# Patient Record
Sex: Male | Born: 1980 | Race: White | Hispanic: No | Marital: Married | Smoking: Former smoker
Health system: Southern US, Community
[De-identification: ages and names within clinical notes are randomized; demographics above are authoritative.]

## PROBLEM LIST (undated history)

## (undated) DIAGNOSIS — I10 Essential (primary) hypertension: Secondary | ICD-10-CM

## (undated) DIAGNOSIS — IMO0002 Reserved for concepts with insufficient information to code with codable children: Secondary | ICD-10-CM

## (undated) HISTORY — DX: Reserved for concepts with insufficient information to code with codable children: IMO0002

## (undated) HISTORY — PX: MULTIPLE TOOTH EXTRACTIONS: SHX2053

---

## 2007-03-25 ENCOUNTER — Ambulatory Visit: Payer: Self-pay | Admitting: Cardiology

## 2007-08-17 ENCOUNTER — Emergency Department (HOSPITAL_COMMUNITY): Admission: EM | Admit: 2007-08-17 | Discharge: 2007-08-17 | Payer: Self-pay | Admitting: Emergency Medicine

## 2010-11-17 LAB — CBC
HCT: 46
Hemoglobin: 15.7
Platelets: 196
RDW: 14.6
WBC: 7.6

## 2010-11-17 LAB — DIFFERENTIAL
Basophils Absolute: 0
Eosinophils Relative: 1
Lymphocytes Relative: 34
Lymphs Abs: 2.6
Neutro Abs: 4.3

## 2010-11-17 LAB — URIC ACID: Uric Acid, Serum: 7.9 — ABNORMAL HIGH

## 2013-08-18 ENCOUNTER — Encounter: Payer: Self-pay | Admitting: Family Medicine

## 2013-08-18 ENCOUNTER — Encounter (INDEPENDENT_AMBULATORY_CARE_PROVIDER_SITE_OTHER): Payer: Self-pay

## 2013-08-18 ENCOUNTER — Ambulatory Visit (INDEPENDENT_AMBULATORY_CARE_PROVIDER_SITE_OTHER): Payer: Medicaid Other | Admitting: Family Medicine

## 2013-08-18 VITALS — BP 131/89 | HR 70 | Temp 98.1°F | Ht 70.0 in | Wt 299.2 lb

## 2013-08-18 DIAGNOSIS — I493 Ventricular premature depolarization: Secondary | ICD-10-CM

## 2013-08-18 DIAGNOSIS — R0789 Other chest pain: Secondary | ICD-10-CM

## 2013-08-18 DIAGNOSIS — R0602 Shortness of breath: Secondary | ICD-10-CM

## 2013-08-18 DIAGNOSIS — I4949 Other premature depolarization: Secondary | ICD-10-CM

## 2013-08-18 DIAGNOSIS — K219 Gastro-esophageal reflux disease without esophagitis: Secondary | ICD-10-CM

## 2013-08-18 DIAGNOSIS — I10 Essential (primary) hypertension: Secondary | ICD-10-CM

## 2013-08-18 LAB — POCT CBC
Granulocyte percent: 60.5 %G (ref 37–80)
HCT, POC: 47.8 % (ref 43.5–53.7)
Hemoglobin: 15.4 g/dL (ref 14.1–18.1)
Lymph, poc: 2.7 (ref 0.6–3.4)
MCH, POC: 29.8 pg (ref 27–31.2)
MCHC: 32.2 g/dL (ref 31.8–35.4)
MCV: 92.6 fL (ref 80–97)
MPV: 9 fL (ref 0–99.8)
POC Granulocyte: 4.9 (ref 2–6.9)
POC LYMPH PERCENT: 33.3 %L (ref 10–50)
Platelet Count, POC: 220 10*3/uL (ref 142–424)
RBC: 5.2 M/uL (ref 4.69–6.13)
RDW, POC: 14.1 %
WBC: 8.1 10*3/uL (ref 4.6–10.2)

## 2013-08-18 MED ORDER — HYDROCHLOROTHIAZIDE 25 MG PO TABS: ORAL_TABLET | ORAL | Status: DC

## 2013-08-18 NOTE — Addendum Note (Signed)
Addended by: Tamera PuntWRAY, WENDY S on: 08/18/2013 09:36 AM   Modules accepted: Orders

## 2013-08-18 NOTE — Progress Notes (Signed)
   Subjective:    Patient ID: Jerry Warren, male    DOB: [birth year], 33 y.o.   MRN: [id number]  HPI This 33 y.o. male presents for evaluation of needing bp medicine.  He has not been seen by medical Manley Fason in over 5 years he states.  He states last week he had an episode of shortness of breath and and sharp chest pain.  He states he was on bp medicine but it ran out and he has been told he needs to get back on bp meds.  He had episode over 5 years ago of chest discomfort and saw a doctor who did an [place] of his heart and was told his heart was ok.  He has family hx of CAD(father).  He does snore but denies having sleep apnea and wife who accompanies states he does not quit breathing at night. He was having some GERD sx's that night as well he states.  He denies SOB or chest pain at present.   Review of Systems C/o SOB, GERD and chest pain.   No  HA, dizziness, vision change, N/V, diarrhea, constipation, dysuria, urinary urgency or frequency, myalgias, arthralgias or rash.  Objective:   Physical Exam  Vital signs noted  Well developed well nourished male.  HEENT - Head atraumatic Normocephalic                Eyes - PERRLA, Conjuctiva - clear Sclera- Clear EOMI                Ears - EAC's Wnl TM's Wnl Gross Hearing WNL                Throat - oropharanx wnl Respiratory - Lungs CTA bilateral Cardiac - RRR S1 and S2 without murmur GI - Abdomen obese soft Nontender and bowel sounds active x 4 Extremities - No edema. Neuro - Grossly intact.  EKG - NSR with PVC's.  No acute st-t changes. 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  Essential hypertension - Plan: hydrochlorothiazide (HYDRODIURIL) 25 MG tablet, POCT CBC, CMP14+EGFR, Lipid panel, Thyroid Panel With TSH, EKG 12-Lead  Shortness of breath - Plan: hydrochlorothiazide (HYDRODIURIL) 25 MG tablet, POCT CBC, CMP14+EGFR, Lipid panel, Thyroid Panel With TSH, EKG  12-Lead  Other chest pain - Plan: hydrochlorothiazide (HYDRODIURIL) 25 MG tablet, POCT CBC, CMP14+EGFR, Lipid panel, Thyroid Panel With TSH, EKG 12-Lead  Gastroesophageal reflux disease without esophagitis - Plan: hydrochlorothiazide (HYDRODIURIL) 25 MG tablet, POCT CBC, CMP14+EGFR, Lipid panel, Thyroid Panel With TSH, EKG 12-Lead  Follow up in 2 weeks  Lysbeth Penner FNP

## 2013-08-19 ENCOUNTER — Other Ambulatory Visit: Payer: Self-pay | Admitting: Family Medicine

## 2013-08-19 DIAGNOSIS — I493 Ventricular premature depolarization: Secondary | ICD-10-CM

## 2013-08-19 LAB — CMP14+EGFR
ALT: 28 IU/L (ref 0–44)
AST: 25 IU/L (ref 0–40)
Albumin/Globulin Ratio: 1.9 (ref 1.1–2.5)
Albumin: 4.9 g/dL (ref 3.5–5.5)
Alkaline Phosphatase: 88 IU/L (ref 39–117)
BUN/Creatinine Ratio: 12 (ref 8–19)
BUN: 17 mg/dL (ref 6–20)
CO2: 22 mmol/L (ref 18–29)
Calcium: 9.4 mg/dL (ref 8.7–10.2)
Chloride: 98 mmol/L (ref 97–108)
Creatinine, Ser: 1.39 mg/dL — ABNORMAL HIGH (ref 0.76–1.27)
GFR calc Af Amer: 76 mL/min/{1.73_m2} (ref >59)
GFR calc non Af Amer: 66 mL/min/{1.73_m2} (ref >59)
Globulin, Total: 2.6 g/dL (ref 1.5–4.5)
Glucose: 92 mg/dL (ref 65–99)
Potassium: 4.5 mmol/L (ref 3.5–5.2)
Sodium: 141 mmol/L (ref 134–144)
Total Bilirubin: 0.4 mg/dL (ref 0.0–1.2)
Total Protein: 7.5 g/dL (ref 6.0–8.5)

## 2013-08-19 LAB — THYROID PANEL WITH TSH
Free Thyroxine Index: 1.8 (ref 1.2–4.9)
T3 Uptake Ratio: 24 % (ref 24–39)
T4, Total: 7.7 ug/dL (ref 4.5–12.0)
TSH: 1.85 u[IU]/mL (ref 0.450–4.500)

## 2013-08-19 LAB — LIPID PANEL
Chol/HDL Ratio: 7.2 ratio units — ABNORMAL HIGH (ref 0.0–5.0)
Cholesterol, Total: 224 mg/dL — ABNORMAL HIGH (ref 100–199)
HDL: 31 mg/dL — ABNORMAL LOW (ref >39)
LDL Calculated: 139 mg/dL — ABNORMAL HIGH (ref 0–99)
Triglycerides: 271 mg/dL — ABNORMAL HIGH (ref 0–149)
VLDL Cholesterol Cal: 54 mg/dL — ABNORMAL HIGH (ref 5–40)

## 2013-08-29 ENCOUNTER — Encounter: Payer: Self-pay | Admitting: Cardiology

## 2013-08-29 ENCOUNTER — Ambulatory Visit (INDEPENDENT_AMBULATORY_CARE_PROVIDER_SITE_OTHER): Payer: Medicaid Other | Admitting: Cardiology

## 2013-08-29 VITALS — BP 142/98 | HR 82 | Ht 71.0 in | Wt 299.8 lb

## 2013-08-29 DIAGNOSIS — R002 Palpitations: Secondary | ICD-10-CM

## 2013-08-29 DIAGNOSIS — I493 Ventricular premature depolarization: Secondary | ICD-10-CM

## 2013-08-29 DIAGNOSIS — I4949 Other premature depolarization: Secondary | ICD-10-CM

## 2013-08-29 DIAGNOSIS — R0602 Shortness of breath: Secondary | ICD-10-CM

## 2013-08-29 NOTE — Patient Instructions (Signed)
Your physician has requested that you have an echocardiogram. Echocardiography is a painless test that uses sound waves to create images of your heart. It provides your doctor with information about the size and shape of your heart and how well your heart's chambers and valves are working. This procedure takes approximately one hour. There are no restrictions for this procedure. Office will contact with results via phone or letter.   Continue all current medications. Follow up based on test results  

## 2013-08-29 NOTE — Progress Notes (Signed)
     Clinical Summary [name] is a 33 y.o.male seen today as a new patient for the following medical problems.  1. Palpitations - reports one day approx 2 weeks ago, feeling of "chest feeling funny". Mild fluttering, with mild SOB. No chest pain. Lasted 30 minutes. Occurred after being outside in heat for awhile. No other episodes of palpitations - primary did EKG showed  NSR with PVCs.  -holter ordered by pcp. Showed NSR with frequent PVCs (24,000 in 24 hrs, 24%), no runs of NSVT or VT. No arrhythmias - K 4.5, TSH 1.85  - denies any lightheadedness or dizziness. No SOB or DOE, does heavy yard work without limitations. No LE edema, no orthopnea or PND.  - rare EtoH, minimal caffeine.   Past Medical History  Diagnosis Date  . Herniated disc      No Known Allergies   Current Outpatient Prescriptions  Medication Sig Dispense Refill  . aspirin 81 MG tablet Take 81 mg by mouth daily.      . hydrochlorothiazide (HYDRODIURIL) 25 MG tablet One half po qd  15 tablet  5   No current facility-administered medications for this visit.     Past Surgical History  Procedure Laterality Date  . Multiple tooth extractions       No Known Allergies    Family History  Problem Relation Age of Onset  . Diabetes Father   . Hypertension Father      Social History [name] reports that he has quit smoking. He has never used smokeless tobacco. [name] reports that he does not drink alcohol.   Review of Systems CONSTITUTIONAL: No weight loss, fever, chills, weakness or fatigue.  HEENT: Eyes: No visual loss, blurred vision, double vision or yellow sclerae.No hearing loss, sneezing, congestion, runny nose or sore throat.  SKIN: No rash or itching.  CARDIOVASCULAR: per HPI RESPIRATORY: No shortness of breath, cough or sputum.  GASTROINTESTINAL: No anorexia, nausea, vomiting or diarrhea. No abdominal pain or blood.  GENITOURINARY: No burning on urination, no polyuria NEUROLOGICAL:  No headache, dizziness, syncope, paralysis, ataxia, numbness or tingling in the extremities. No change in bowel or bladder control.  MUSCULOSKELETAL: No muscle, back pain, joint pain or stiffness.  LYMPHATICS: No enlarged nodes. No history of splenectomy.  PSYCHIATRIC: No history of depression or anxiety.  ENDOCRINOLOGIC: No reports of sweating, cold or heat intolerance. No polyuria or polydipsia.  Marland Kitchen.   Physical Examination p 82 bp 142/98 Wt 299 lbs BMI 42 Gen: resting comfortably, no acute distress HEENT: no scleral icterus, pupils equal round and reactive, no palptable cervical adenopathy,  CV: RRR, no m/r/g, no JVD, no carotid bruits Resp: Clear to auscultation bilaterally GI: abdomen is soft, non-tender, non-distended, normal bowel sounds, no hepatosplenomegaly MSK: extremities are warm, no edema.  Skin: warm, no rash Neuro:  no focal deficits Psych: appropriate affect   Assessment and Plan  1. Palpitations - isolated fairly mild episode, however holter shows heavy PVC burden at 24% (over 24,000), appear unifocal. No NSVT or VT. Frequency that can potentially be associated with a PVC induced cardiomyopathy.  - will obtain echo as initial evaluation for structural heart disease, further workup pending results. Likely initiate beta blocker once LVEF is known to suppress PVCs. Consider ischemic evaluation and/or potential EP eval pending echo results.     F/u pending echo results  Antoine PocheJonathan F. Branch, M.D., F.A.C.C.

## 2013-10-01 ENCOUNTER — Other Ambulatory Visit: Payer: Medicaid Other

## 2013-10-06 ENCOUNTER — Other Ambulatory Visit: Payer: Self-pay

## 2013-10-06 ENCOUNTER — Other Ambulatory Visit (INDEPENDENT_AMBULATORY_CARE_PROVIDER_SITE_OTHER): Payer: Medicaid Other

## 2013-10-06 DIAGNOSIS — I4949 Other premature depolarization: Secondary | ICD-10-CM

## 2013-10-06 DIAGNOSIS — R0602 Shortness of breath: Secondary | ICD-10-CM

## 2013-10-09 ENCOUNTER — Telehealth: Payer: Self-pay | Admitting: *Deleted

## 2013-10-09 ENCOUNTER — Encounter: Payer: Self-pay | Admitting: *Deleted

## 2013-10-09 DIAGNOSIS — R079 Chest pain, unspecified: Secondary | ICD-10-CM

## 2013-10-09 DIAGNOSIS — I493 Ventricular premature depolarization: Secondary | ICD-10-CM

## 2013-10-09 NOTE — Telephone Encounter (Signed)
Message copied by Lesle ChrisHILL, ANGELA G on Thu Oct 09, 2013  1:58 PM ------      Message from: GypsumBRANCH, JONATHAN F      Created: Wed Oct 08, 2013  1:55 PM       Echo looks good. The cause of his frequent irregular heart beats remains unclear. One possible cause could be blockages in the arteries of the heart, though less likely due to his age. He needs an exercise cardiolite if he can exercise, if not then [facility]. He does not need to hold any Emelda Brothersmes            J Branch MD ------

## 2013-10-09 NOTE — Telephone Encounter (Signed)
Notes Recorded by Lesle ChrisAngela G Hill, LPN on 4/09/81198/20/2015 at 1:58 PM Patient notified. He is in agreement to do the Exercise Cardiolite. Instructions given to not eat/drink x 4 hours prior to test & no caffeine x 24 hours. Also, informed that he does not need to hold any medications prior to test.  [facility] for scheduling.

## 2013-10-16 ENCOUNTER — Other Ambulatory Visit: Payer: Self-pay | Admitting: Cardiology

## 2013-10-16 DIAGNOSIS — R079 Chest pain, unspecified: Secondary | ICD-10-CM

## 2013-10-17 ENCOUNTER — Encounter (HOSPITAL_COMMUNITY): Payer: Self-pay

## 2013-10-17 ENCOUNTER — Ambulatory Visit (HOSPITAL_COMMUNITY)
Admission: RE | Admit: 2013-10-17 | Discharge: 2013-10-17 | Disposition: A | Discharge status: Home / Self Care | Payer: Medicaid Other | Source: Ambulatory Visit | Attending: Cardiology | Admitting: Cardiology

## 2013-10-17 ENCOUNTER — Encounter (HOSPITAL_COMMUNITY)
Admission: RE | Admit: 2013-10-17 | Discharge: 2013-10-17 | Disposition: A | Discharge status: Home / Self Care | Payer: Medicaid Other | Source: Ambulatory Visit | Attending: Cardiology | Admitting: Cardiology

## 2013-10-17 DIAGNOSIS — I10 Essential (primary) hypertension: Secondary | ICD-10-CM | POA: Insufficient documentation

## 2013-10-17 DIAGNOSIS — R002 Palpitations: Secondary | ICD-10-CM | POA: Insufficient documentation

## 2013-10-17 DIAGNOSIS — I4949 Other premature depolarization: Secondary | ICD-10-CM | POA: Diagnosis not present

## 2013-10-17 DIAGNOSIS — R079 Chest pain, unspecified: Secondary | ICD-10-CM

## 2013-10-17 HISTORY — DX: Essential (primary) hypertension: I10

## 2013-10-17 MED ORDER — SODIUM CHLORIDE 0.9 % IJ SOLN
INTRAMUSCULAR | Status: AC
  Filled 2013-10-17: qty 24

## 2013-10-17 MED ORDER — SODIUM CHLORIDE 0.9 % IJ SOLN
10.0000 mL | INTRAMUSCULAR | Status: DC | PRN
  Administered 2013-10-17: 10 mL via INTRAVENOUS

## 2013-10-17 MED ORDER — REGADENOSON 0.4 MG/5ML IV SOLN
INTRAVENOUS | Status: AC
  Filled 2013-10-17: qty 5

## 2013-10-17 MED ORDER — SODIUM CHLORIDE 0.9 % IJ SOLN
INTRAMUSCULAR | Status: AC
  Administered 2013-10-17: 10 mL via INTRAVENOUS
  Filled 2013-10-17: qty 10

## 2013-10-17 MED ORDER — TECHNETIUM TC 99M SESTAMIBI - CARDIOLITE
10.0000 | Freq: Once | INTRAVENOUS | Status: AC | PRN
  Administered 2013-10-17: 10 via INTRAVENOUS

## 2013-10-17 MED ORDER — TECHNETIUM TC 99M SESTAMIBI GENERIC - CARDIOLITE
30.0000 | Freq: Once | INTRAVENOUS | Status: AC | PRN
  Administered 2013-10-17: 30 via INTRAVENOUS

## 2013-10-17 NOTE — Progress Notes (Signed)
Stress Lab Nurses Notes - [facility]  OSRIC KLOPF 10/17/2013 Reason for doing test: Palpitations Type of test: Stress Cardiolite Nurse performing test: Parke Poisson, RN Nuclear Medicine Tech: Lyndel Pleasure Echo Tech: Not Applicable MD performing test: Koneswaran/K.Lyman Bishop NP Family MD: Christell Constant Test explained and consent signed: Yes.   IV started: Saline lock flushed, No redness or edema and Saline lock started in radiology Symptoms: SOB & Fatigue Treatment/Intervention: None Reason test stopped: fatigue After recovery IV was: Discontinued via X-ray tech and No redness or edema Patient to return to Nuc. Med at : 12:00 Patient discharged: Home Patient's Condition upon discharge was: stable Comments: During test peak BP 188/73 & HR 166.  Recovery BP 113/80 & HR 98.  Symptoms resolved in recovery. Erskine Speed T

## 2013-10-21 ENCOUNTER — Telehealth: Payer: Self-pay | Admitting: *Deleted

## 2013-10-21 NOTE — Telephone Encounter (Signed)
Notes Recorded by Lesle Chris, LPN on 4/0/9811 at 5:38 PM Left message to return call.

## 2013-10-21 NOTE — Telephone Encounter (Signed)
Message copied by Lesle Chris on Tue Oct 21, 2013  5:38 PM ------      Message from: [place] F      Created: Mon Oct 20, 2013  3:09 PM       Stress test looks good, no evidence of any blockages. It remains unclear why his heart has so many extra beats at times. I would like to start him on metoprolol  bid to suppress these extra beats, overtime that frequent amount of beats can cause weakness of the heart. I would also like to refer him to Dr Johney Frame for frequent PVCs. F/u with me in 4 months.            Dominga Ferry MD ------

## 2013-10-22 NOTE — Telephone Encounter (Signed)
RETURNING CALL  °

## 2013-10-23 MED ORDER — METOPROLOL TARTRATE 25 MG PO TABS: 25.0000 mg | ORAL_TABLET | Freq: Two times a day (BID) | ORAL | Status: DC

## 2013-10-23 NOTE — Telephone Encounter (Signed)
Notes Recorded by Lesle Chris, LPN on 02/25/1094 at 11:58 AM Patient notified. Will send new rx to [facility]. States that he prefers to hold off on seeing Dr. Johney Frame just yet. Would like to see how this medications does first. Advised him to call back in about a month to give [place] update. He will see you back in 4 months. Informed patient that he could reassess the need to see Dr. Johney Frame at that time, unless MD feels strongly that he should be seen sooner. Patient verbalized understanding. ------

## 2014-03-27 ENCOUNTER — Ambulatory Visit (INDEPENDENT_AMBULATORY_CARE_PROVIDER_SITE_OTHER): Payer: Medicaid Other | Admitting: Cardiology

## 2014-03-27 ENCOUNTER — Encounter: Payer: Self-pay | Admitting: Cardiology

## 2014-03-27 VITALS — BP 140/88 | HR 81 | Ht 71.0 in | Wt 303.0 lb

## 2014-03-27 DIAGNOSIS — R002 Palpitations: Secondary | ICD-10-CM

## 2014-03-27 MED ORDER — METOPROLOL TARTRATE 25 MG PO TABS: 25.0000 mg | ORAL_TABLET | Freq: Two times a day (BID) | ORAL | Status: DC

## 2014-03-27 MED ORDER — HYDROCHLOROTHIAZIDE 25 MG PO TABS: ORAL_TABLET | ORAL | Status: DC

## 2014-03-27 NOTE — Patient Instructions (Signed)
Your physician wants you to follow-up in: 1 YEAR WITH DR. BRANCH You will receive a reminder letter in the mail two months in advance. If you don't receive a letter, please call our office to schedule the follow-up appointment.  Your physician recommends that you continue on your current medications as directed. Please refer to the Current Medication list given to you today.  WE HAVE REFILLED METOPROLOL AND HCTZ  Thank you for choosing [facility]!!

## 2014-03-27 NOTE — Progress Notes (Signed)
Clinical Summary [name] is a 34 y.o.male seen today for follow up of the following medical problems.   1. Palpitations  -previous holter showed NSR with frequent PVCs (24,000 in 24 hrs, 24%), no runs of NSVT or VT. No arrhythmias - no structural heart disease by echo or stress testint  - denies any recent palpitations. Tolerating beta blocker without issues.    Past Medical History  Diagnosis Date  . Herniated disc   . Hypertension      No Known Allergies   Current Outpatient Prescriptions  Medication Sig Dispense Refill  . hydrochlorothiazide (HYDRODIURIL) 25 MG tablet One half po qd 15 tablet 5  . metoprolol tartrate (LOPRESSOR) 25 MG tablet Take 1 tablet (25 mg total) by mouth 2 (two) times daily. 60 tablet 6  . Omega-3 Fatty Acids (FISH OIL) 1000 MG CAPS Take 1 capsule by mouth daily.     No current facility-administered medications for this visit.     Past Surgical History  Procedure Laterality Date  . Multiple tooth extractions       No Known Allergies    Family History  Problem Relation Age of Onset  . Diabetes Father   . Hypertension Father      Social History [name] reports that he has quit smoking. He has never used smokeless tobacco. [name] reports that he does not drink alcohol.   Review of Systems CONSTITUTIONAL: No weight loss, fever, chills, weakness or fatigue.  HEENT: Eyes: No visual loss, blurred vision, double vision or yellow sclerae.No hearing loss, sneezing, congestion, runny nose or sore throat.  SKIN: No rash or itching.  CARDIOVASCULAR: per HPI RESPIRATORY: No shortness of breath, cough or sputum.  GASTROINTESTINAL: No anorexia, nausea, vomiting or diarrhea. No abdominal pain or blood.  GENITOURINARY: No burning on urination, no polyuria NEUROLOGICAL: No headache, dizziness, syncope, paralysis, ataxia, numbness or tingling in the extremities. No change in bowel or bladder control.  MUSCULOSKELETAL: No muscle, back  pain, joint pain or stiffness.  LYMPHATICS: No enlarged nodes. No history of splenectomy.  PSYCHIATRIC: No history of depression or anxiety.  ENDOCRINOLOGIC: No reports of sweating, cold or heat intolerance. No polyuria or polydipsia.  Marland Kitchen.   Physical Examination p 81 bp 140/88 Wt 303 BMI 42 Gen: resting comfortably, no acute distress HEENT: no scleral icterus, pupils equal round and reactive, no palptable cervical adenopathy,  CV: RRR, no m/r/g, no JVD Resp: Clear to auscultation bilaterally GI: abdomen is soft, non-tender, non-distended, normal bowel sounds, no hepatosplenomegaly MSK: extremities are warm, no edema.  Skin: warm, no rash Neuro:  no focal deficits Psych: appropriate affect   Diagnostic Studies 09/2013 Echo Study Conclusions  - Left ventricle: The cavity size was normal. There was mild septal and moderate posterior wall hypertrophy. Systolic function was normal. The estimated ejection fraction was in the range of 55% to 60%. Wall motion was normal; there were no regional wall motion abnormalities. Doppler parameters are consistent with abnormal left ventricular relaxation (grade 1 diastolic dysfunction).  09/2013 MPI FINDINGS: Stress/ECG data: The patient was stressed according to the Bruce protocol for 8 min 26 seconds achieving a work level of 10.4 Mets. The resting heart rate of 76 beats per min rose to a maximal heart rate of 169 beats per min. This value represents 90% of the maximal, age predicted heart rate. The resting blood pressure of 130/95 rose to 188/73. The stress test was stopped due to fatigue. No chest pain was reported.  Resting ECG demonstrated normal sinus rhythm. With stress, there were no diagnostic ST segment or T-wave abnormalities. Isolated PVCs were noted. No evidence of ventricular tachycardia.  Perfusion: No decreased activity in the left ventricle on stress imaging to suggest reversible ischemia or  infarction.  Wall Motion: Normal left ventricular wall motion. No left ventricular dilation.  Left Ventricular Ejection Fraction: 49 %  End diastolic volume 114 ml  End systolic volume 59 ml  IMPRESSION: 1. No reversible ischemia or infarction.  2. Normal left ventricular wall motion.  3. Left ventricular ejection fraction 49%. However, grossly there appeared to be normal LV systolic funtion. Would recommend echocardiogram for more accurate EF assessment.  4. Low-risk stress test findings*.    Assessment and Plan  1. Palpitations - isolated fairly mild episode, however holter shows heavy PVC burden at 24% (over 24,000), appear unifocal. No NSVT or VT.  - no structural heart disease by echo or stress test - symptoms well controlled on beta blocker, continue current therapy     Antoine Poche, M.D.

## 2014-05-20 ENCOUNTER — Other Ambulatory Visit: Payer: Self-pay | Admitting: Family Medicine

## 2014-12-25 ENCOUNTER — Other Ambulatory Visit: Payer: Self-pay | Admitting: *Deleted

## 2014-12-25 MED ORDER — METOPROLOL TARTRATE 25 MG PO TABS: 25.0000 mg | ORAL_TABLET | Freq: Two times a day (BID) | ORAL | Status: DC

## 2015-02-05 ENCOUNTER — Other Ambulatory Visit: Payer: Self-pay

## 2015-02-05 ENCOUNTER — Other Ambulatory Visit: Payer: Self-pay | Admitting: *Deleted

## 2015-02-05 DIAGNOSIS — R002 Palpitations: Secondary | ICD-10-CM

## 2015-02-05 MED ORDER — HYDROCHLOROTHIAZIDE 25 MG PO TABS
ORAL_TABLET | ORAL | Status: DC
Start: 2015-02-05 — End: 2015-04-03

## 2015-04-03 ENCOUNTER — Other Ambulatory Visit: Payer: Self-pay | Admitting: Cardiology

## 2015-05-09 ENCOUNTER — Other Ambulatory Visit: Payer: Self-pay | Admitting: Cardiology

## 2015-05-17 ENCOUNTER — Other Ambulatory Visit: Payer: Self-pay | Admitting: *Deleted

## 2015-05-17 MED ORDER — METOPROLOL TARTRATE 25 MG PO TABS: 25.0000 mg | ORAL_TABLET | Freq: Two times a day (BID) | ORAL | Status: DC

## 2015-06-21 ENCOUNTER — Ambulatory Visit (INDEPENDENT_AMBULATORY_CARE_PROVIDER_SITE_OTHER): Payer: Medicaid Other | Admitting: Cardiology

## 2015-06-21 ENCOUNTER — Encounter: Payer: Self-pay | Admitting: Cardiology

## 2015-06-21 ENCOUNTER — Other Ambulatory Visit: Payer: Self-pay | Admitting: *Deleted

## 2015-06-21 VITALS — BP 147/89 | HR 76 | Ht 71.0 in | Wt 309.0 lb

## 2015-06-21 DIAGNOSIS — R7309 Other abnormal glucose: Secondary | ICD-10-CM | POA: Diagnosis not present

## 2015-06-21 DIAGNOSIS — R002 Palpitations: Secondary | ICD-10-CM

## 2015-06-21 DIAGNOSIS — I10 Essential (primary) hypertension: Secondary | ICD-10-CM | POA: Diagnosis not present

## 2015-06-21 DIAGNOSIS — I493 Ventricular premature depolarization: Secondary | ICD-10-CM | POA: Diagnosis not present

## 2015-06-21 MED ORDER — METOPROLOL TARTRATE 25 MG PO TABS: 25.0000 mg | ORAL_TABLET | Freq: Two times a day (BID) | ORAL | Status: DC

## 2015-06-21 MED ORDER — HYDROCHLOROTHIAZIDE 25 MG PO TABS: 12.5000 mg | ORAL_TABLET | Freq: Every day | ORAL | Status: DC

## 2015-06-21 NOTE — Progress Notes (Signed)
Patient ID: Jerry Warren, male   DOB: [birth year], 35 y.o.   MRN: [id number]     Clinical Summary Jerry Warren is a 35 y.o.male seen today for follow up of the following medical problems.   1. Palpitations/PVCs -previous holter showed NSR with frequent PVCs (24,000 in 24 hrs, 24%), no runs of NSVT or VT. No arrhythmias - no structural heart disease by echo or stress testint - denies any palpitations. Denies any SOB. He has done well since starting lopressor - compliant with meds   Past Medical History  Diagnosis Date  . Herniated disc   . Hypertension      No Known Allergies   Current Outpatient Prescriptions  Medication Sig Dispense Refill  . hydrochlorothiazide (HYDRODIURIL) 25 MG tablet TAKE 1/2 TABLET BY MOUTH DAILY 3 tablet 0  . metoprolol tartrate (LOPRESSOR) 25 MG tablet Take 1 tablet (25 mg total) by mouth 2 (two) times daily. 60 tablet 1  . Omega-3 Fatty Acids (FISH OIL) 1000 MG CAPS Take 1 capsule by mouth daily.     No current facility-administered medications for this visit.     Past Surgical History  Procedure Laterality Date  . Multiple tooth extractions       No Known Allergies    Family History  Problem Relation Age of Onset  . Diabetes Father   . Hypertension Father      Social History Jerry Warren reports that he quit smoking about 21 months ago. He has never used smokeless tobacco. Jerry Warren reports that he does not drink alcohol.   Review of Systems CONSTITUTIONAL: No weight loss, fever, chills, weakness or fatigue.  HEENT: Eyes: No visual loss, blurred vision, double vision or yellow sclerae.No hearing loss, sneezing, congestion, runny nose or sore throat.  SKIN: No rash or itching.  CARDIOVASCULAR: per HPI RESPIRATORY: No shortness of breath, cough or sputum.  GASTROINTESTINAL: No anorexia, nausea, vomiting or diarrhea. No abdominal pain or blood.  GENITOURINARY: No burning on urination, no polyuria NEUROLOGICAL: No headache, dizziness,  syncope, paralysis, ataxia, numbness or tingling in the extremities. No change in bowel or bladder control.  MUSCULOSKELETAL: No muscle, back pain, joint pain or stiffness.  LYMPHATICS: No enlarged nodes. No history of splenectomy.  PSYCHIATRIC: No history of depression or anxiety.  ENDOCRINOLOGIC: No reports of sweating, cold or heat intolerance. No polyuria or polydipsia.  Marland Kitchen   Physical Examination Filed Vitals:   06/21/15 1548  BP: 147/89  Pulse: 76   Filed Vitals:   06/21/15 1548  Height:  (1.803 m)  Weight: 309 lb (140.161 kg)   Manual bp: 140/90  Gen: resting comfortably, no acute distress HEENT: no scleral icterus, pupils equal round and reactive, no palptable cervical adenopathy,  CV: RRR, no m/r,g no jvd Resp: Clear to auscultation bilaterally GI: abdomen is soft, non-tender, non-distended, normal bowel sounds, no hepatosplenomegaly MSK: extremities are warm, no edema.  Skin: warm, no rash Neuro:  no focal deficits Psych: appropriate affect   Diagnostic Studies 09/2013 Echo Study Conclusions  - Left ventricle: The cavity size was normal. There was mild septal and moderate posterior wall hypertrophy. Systolic function was normal. The estimated ejection fraction was in the range of 55% to 60%. Wall motion was normal; there were no regional wall motion abnormalities. Doppler parameters are consistent with abnormal left ventricular relaxation (grade 1 diastolic dysfunction).  09/2013 MPI FINDINGS: Stress/ECG data: The patient was stressed according to the Bruce protocol for 8 min 26 seconds achieving a work level  of 10.4 Mets. The resting heart rate of 76 beats per min rose to a maximal heart rate of 169 beats per min. This value represents 90% of the maximal, age predicted heart rate. The resting blood pressure of 130/95 rose to 188/73. The stress test was stopped due to fatigue. No chest pain was reported.  Resting ECG demonstrated normal  sinus rhythm. With stress, there were no diagnostic ST segment or T-wave abnormalities. Isolated PVCs were noted. No evidence of ventricular tachycardia.  Perfusion: No decreased activity in the left ventricle on stress imaging to suggest reversible ischemia or infarction.  Wall Motion: Normal left ventricular wall motion. No left ventricular dilation.  Left Ventricular Ejection Fraction: 49 %  End diastolic volume 114 ml  End systolic volume 59 ml  IMPRESSION: 1. No reversible ischemia or infarction.  2. Normal left ventricular wall motion.  3. Left ventricular ejection fraction 49%. However, grossly there appeared to be normal LV systolic funtion. Would recommend echocardiogram for more accurate EF assessment.  4. Low-risk stress test findings*.    Assessment and Plan  1. Palpitations/PVCs -  holter showedeavy PVC burden at 24% (over 24,000), appear unifocal. No NSVT or VT.  - no structural heart disease by echo or stress test - symptoms well controlled on beta blocker - EKG in clinic today shows normal sinus rhythm, no PVCs - we will repeat holter to see if PVC burden decreased on beta blocker, his burden at last check prior to beta blocker was high enough to raise concern for the development of a PVC medicated cardiomyopathy, if still high burden titrate up beta blocker   2. HTN - bp by manual check right at cut off of 140/90. Continue currrent meds, given info on DASH diet. Continue to monitor    F/u 1 year. We will order his annual labs.       Jerry PocheJonathan F. Askia Warren, M.D.

## 2015-06-21 NOTE — Patient Instructions (Signed)
Your physician wants you to follow-up in: 1 year with Dr. Lurena Joiner will receive a reminder letter in the mail two months in advance. If you don't receive a letter, please call our office to schedule the follow-up appointment.  Your physician recommends that you continue on your current medications as directed. Please refer to the Current Medication list given to you today.  Your physician recommends that you return for lab work BMP.CBC.TSH.HGBA1C.MG.LIPIDS  Your physician has recommended that you wear a holter monitor FOR 24 HOURS. Holter monitors are medical devices that record the heart's electrical activity. Doctors most often use these monitors to diagnose arrhythmias. Arrhythmias are problems with the speed or rhythm of the heartbeat. The monitor is a small, portable device. You can wear one while you do your normal daily activities. This is usually used to diagnose what is causing palpitations/syncope (passing out).  Thank you for choosing [facility]!!  DASH Eating Plan DASH stands for "Dietary Approaches to Stop Hypertension." The DASH eating plan is a healthy eating plan that has been shown to reduce high blood pressure (hypertension). Additional health benefits may include reducing the risk of type 2 diabetes mellitus, heart disease, and stroke. The DASH eating plan may also help with weight loss. WHAT DO I NEED TO KNOW ABOUT THE DASH EATING PLAN? For the DASH eating plan, you will follow these general guidelines:  Choose foods with a percent daily value for sodium of less than 5% (as listed on the food label).  Use salt-free seasonings or herbs instead of table salt or sea salt.  Check with your health care provider or pharmacist before using salt substitutes.  Eat lower-sodium products, often labeled as "lower sodium" or "no salt added."  Eat fresh foods.  Eat more vegetables, fruits, and low-fat dairy products.  Choose whole grains. Look for the word "whole" as the  first word in the ingredient list.  Choose fish and skinless chicken or [place] more often than red meat. Limit fish, poultry, and meat to 6 oz (170 g) each day.  Limit sweets, desserts, sugars, and sugary drinks.  Choose heart-healthy fats.  Limit cheese to 1 oz (28 g) per day.  Eat more home-cooked food and less restaurant, buffet, and fast food.  Limit fried foods.  Cook foods using methods other than frying.  Limit canned vegetables. If you do use them, rinse them well to decrease the sodium.  When eating at a restaurant, ask that your food be prepared with less salt, or no salt if possible. WHAT FOODS CAN I EAT? Seek help from a dietitian for individual calorie needs. Grains Whole grain or whole wheat bread. Brown rice. Whole grain or whole wheat pasta. Quinoa, bulgur, and whole grain cereals. Low-sodium cereals. Corn or whole wheat flour tortillas. Whole grain cornbread. Whole grain crackers. Low-sodium crackers. Vegetables Fresh or frozen vegetables (raw, steamed, roasted, or grilled). Low-sodium or reduced-sodium tomato and vegetable juices. Low-sodium or reduced-sodium tomato sauce and paste. Low-sodium or reduced-sodium canned vegetables.  Fruits All fresh, canned (in natural juice), or frozen fruits. Meat and Other Protein Products Ground beef (85% or leaner), grass-fed beef, or beef trimmed of fat. Skinless chicken or [place]. Ground chicken or [place]. Pork trimmed of fat. All fish and seafood. Eggs. Dried beans, peas, or lentils. Unsalted nuts and seeds. Unsalted canned beans. Dairy Low-fat dairy products, such as skim or 1% milk, 2% or reduced-fat cheeses, low-fat ricotta or cottage cheese, or plain low-fat yogurt. Low-sodium or reduced-sodium cheeses. Fats  and Oils Tub margarines without trans fats. Light or reduced-fat mayonnaise and salad dressings (reduced sodium). Avocado. Safflower, olive, or canola oils. Natural peanut or almond butter. Other Unsalted popcorn and  pretzels. The items listed above may not be a complete list of recommended foods or beverages. Contact your dietitian for more options. WHAT FOODS ARE NOT RECOMMENDED? Grains White bread. White pasta. White rice. Refined cornbread. Bagels and croissants. Crackers that contain trans fat. Vegetables Creamed or fried vegetables. Vegetables in a cheese sauce. Regular canned vegetables. Regular canned tomato sauce and paste. Regular tomato and vegetable juices. Fruits Dried fruits. Canned fruit in light or heavy syrup. Fruit juice. Meat and Other Protein Products Fatty cuts of meat. Ribs, chicken wings, bacon, sausage, bologna, salami, chitterlings, fatback, hot dogs, bratwurst, and packaged luncheon meats. Salted nuts and seeds. Canned beans with salt. Dairy Whole or 2% milk, cream, half-and-half, and cream cheese. Whole-fat or sweetened yogurt. Full-fat cheeses or blue cheese. Nondairy creamers and whipped toppings. Processed cheese, cheese spreads, or cheese curds. Condiments Onion and garlic salt, seasoned salt, table salt, and sea salt. Canned and packaged gravies. Worcestershire sauce. Tartar sauce. Barbecue sauce. Teriyaki sauce. Soy sauce, including reduced sodium. Steak sauce. Fish sauce. Oyster sauce. Cocktail sauce. Horseradish. Ketchup and mustard. Meat flavorings and tenderizers. Bouillon cubes. Hot sauce. Tabasco sauce. Marinades. Taco seasonings. Relishes. Fats and Oils Butter, stick margarine, lard, shortening, ghee, and bacon fat. Coconut, palm kernel, or palm oils. Regular salad dressings. Other Pickles and olives. Salted popcorn and pretzels. The items listed above may not be a complete list of foods and beverages to avoid. Contact your dietitian for more information. WHERE CAN I FIND MORE INFORMATION? National Heart, Lung, and Blood Institute: CableProm[employer].itwww.nhlbi.nih.gov/health/health-topics/topics/dash/   This information is not intended to replace advice given to you by your health care  provider. Make sure you discuss any questions you have with your health care provider.   Document Released: 01/26/2011 Document Revised: 02/27/2014 Document Reviewed: 12/11/2012 Elsevier Interactive Patient Education [employer]2016 Elsevier Inc.

## 2015-06-25 ENCOUNTER — Telehealth: Payer: Self-pay | Admitting: *Deleted

## 2015-06-25 NOTE — Telephone Encounter (Signed)
Pt made aware at nurse monitor visit, routed to pcp

## 2015-06-25 NOTE — Telephone Encounter (Signed)
No pcp

## 2015-06-25 NOTE — Telephone Encounter (Signed)
-----   Message from Antoine PocheJonathan F Branch, MD sent at 06/24/2015  1:12 PM EDT ----- Labs look good other than cholesterol is a little high, continue to work on diet and weight loss, would not make medication changes at this time  J BrancH MD

## 2015-06-29 ENCOUNTER — Ambulatory Visit (INDEPENDENT_AMBULATORY_CARE_PROVIDER_SITE_OTHER): Payer: Medicaid Other

## 2015-06-29 DIAGNOSIS — R002 Palpitations: Secondary | ICD-10-CM | POA: Diagnosis not present

## 2015-07-02 ENCOUNTER — Telehealth: Payer: Self-pay | Admitting: Cardiology

## 2015-07-02 NOTE — Telephone Encounter (Signed)
Mr. Jerry SalterHaley called stating that he is returning a call.

## 2015-07-05 NOTE — Telephone Encounter (Signed)
Pt aware, no pcp 

## 2015-07-06 ENCOUNTER — Ambulatory Visit (INDEPENDENT_AMBULATORY_CARE_PROVIDER_SITE_OTHER): Payer: Medicaid Other

## 2015-07-06 ENCOUNTER — Encounter: Payer: Self-pay | Admitting: Family Medicine

## 2015-07-06 ENCOUNTER — Ambulatory Visit (INDEPENDENT_AMBULATORY_CARE_PROVIDER_SITE_OTHER): Payer: Medicaid Other | Admitting: Family Medicine

## 2015-07-06 VITALS — BP 132/87 | HR 72 | Temp 98.2°F | Ht 71.0 in | Wt 306.6 lb

## 2015-07-06 DIAGNOSIS — M546 Pain in thoracic spine: Secondary | ICD-10-CM

## 2015-07-06 MED ORDER — NAPROXEN 500 MG PO TABS: 500.0000 mg | ORAL_TABLET | Freq: Two times a day (BID) | ORAL | Status: DC

## 2015-07-06 NOTE — Progress Notes (Signed)
   Subjective:    Patient ID: Jerry Warren, male    DOB: Jul 07, 1980, 35 y.o.   MRN: [id number]  HPI patient has some left upper back pain that radiates around to his abdomen. There is been no injury or trauma. He also has some symptoms of reflux and takes ranitidine and Tums which helped that problem. He has a history of lumbar disc disease having received injections in the past.  Patient Active Problem List   Diagnosis Date Noted  . Palpitations 08/29/2013  . 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 hydrochlorothiazide (HYDRODIURIL) 25 MG tablet Take 0.5 tablets (12.5 mg total) by mouth daily.  . metoprolol tartrate (LOPRESSOR) 25 MG tablet Take 1 tablet (25 mg total) by mouth 2 (two) times daily.  . [DISCONTINUED] Omega-3 Fatty Acids (FISH OIL) 1000 MG CAPS Take 1 capsule by mouth daily.   No facility-administered encounter medications on file as of 07/06/2015.      Review of Systems  Constitutional: Negative.   Musculoskeletal: Positive for gait problem. Negative for neck pain and neck stiffness.       Objective:   Physical Exam  Constitutional: He appears well-developed and well-nourished.  Abdominal: Soft. There is no tenderness. There is no rebound.  Musculoskeletal:  Back normal range of motion no increased pain with lateral bending or twisting. Abdomen is soft nontender no guarding or rebound          Assessment & Plan:  1. Left-sided thoracic back pain There appears to be some narrowing of the disc spaces I suspect he may have a thoracic disc with radiation of the pain around his torso and symptoms not really affected by change in present position per se.  Will try anti-inflammatory Naprosyn 500 mg twice a day. Have asked him to go ahead and take his ranitidine 150 mg twice a day for GI protection with the Naprosyn. If symptoms aren't better in a week or so consider prednisone for same effect  Frederica KusterStephen M  Cheyna Retana MD - DG Thoracic Spine 2 View; Future

## 2015-07-18 ENCOUNTER — Other Ambulatory Visit: Payer: Self-pay | Admitting: Family Medicine

## 2015-07-20 NOTE — Telephone Encounter (Signed)
Last filled 07/06/2015

## 2015-07-22 ENCOUNTER — Telehealth: Payer: Self-pay | Admitting: Family Medicine

## 2015-07-22 MED ORDER — NAPROXEN 500 MG PO TABS: 500.0000 mg | ORAL_TABLET | Freq: Two times a day (BID) | ORAL | Status: DC

## 2015-07-22 NOTE — Telephone Encounter (Signed)
Pts wife is aware 

## 2015-07-22 NOTE — Telephone Encounter (Signed)
Rx sent for 1 month  Agree with his PCP that he should take ranitidine twice daily to protect from ulcer development.  Should follow up with PCP for discussion if he is going to need it long term.   Murtis SinkSam Nikolus Marczak, MD [facility] 07/22/2015, 10:09 AM

## 2015-08-19 ENCOUNTER — Other Ambulatory Visit: Payer: Self-pay | Admitting: Family Medicine

## 2015-08-19 MED ORDER — NAPROXEN 500 MG PO TABS: 500.0000 mg | ORAL_TABLET | Freq: Two times a day (BID) | ORAL | Status: DC

## 2015-08-19 NOTE — Telephone Encounter (Signed)
Pt aware.

## 2015-08-19 NOTE — Telephone Encounter (Signed)
Refilled    Murtis SinkSam Riggin Cuttino, MD [facility] 08/19/2015, 10:00 AM

## 2015-09-16 ENCOUNTER — Other Ambulatory Visit: Payer: Self-pay | Admitting: Family Medicine

## 2015-09-20 ENCOUNTER — Other Ambulatory Visit: Payer: Self-pay | Admitting: Family Medicine

## 2015-11-12 ENCOUNTER — Other Ambulatory Visit: Payer: Self-pay | Admitting: Family Medicine

## 2015-12-15 ENCOUNTER — Other Ambulatory Visit: Payer: Self-pay | Admitting: Family Medicine

## 2016-01-17 ENCOUNTER — Other Ambulatory Visit: Payer: Self-pay | Admitting: Family Medicine

## 2016-02-15 ENCOUNTER — Other Ambulatory Visit: Payer: Self-pay | Admitting: Family Medicine

## 2016-03-21 ENCOUNTER — Other Ambulatory Visit: Payer: Self-pay | Admitting: *Deleted

## 2016-03-21 MED ORDER — NAPROXEN 500 MG PO TABS: 500.0000 mg | ORAL_TABLET | Freq: Two times a day (BID) | ORAL | 0 refills | Status: DC

## 2016-03-22 ENCOUNTER — Other Ambulatory Visit: Payer: Self-pay

## 2016-03-22 MED ORDER — NAPROXEN 500 MG PO TABS: 500.0000 mg | ORAL_TABLET | Freq: Two times a day (BID) | ORAL | 0 refills | Status: DC

## 2016-03-30 ENCOUNTER — Telehealth: Payer: Self-pay | Admitting: Family Medicine

## 2016-03-30 NOTE — Telephone Encounter (Signed)
Please schedule patient for an appointment to be seen by a provider

## 2016-03-30 NOTE — Telephone Encounter (Signed)
Pt hasn't been seen since May of 2017, does he ntbs again or can the naproxen be sent into the pharmacy?

## 2016-03-30 NOTE — Telephone Encounter (Signed)
Pt's wife advised he would ntbs and she states he no longer has medicaid and an office visit would cost too much. Advised pt Aleve is the same as Naproxen and you can get that otc and pt's wife said it doesn't help. Advised we would need to see pt before an rx could be sent over. Pt's wife voiced understanding.

## 2016-07-27 ENCOUNTER — Other Ambulatory Visit: Payer: Self-pay | Admitting: Physician Assistant

## 2016-07-27 ENCOUNTER — Other Ambulatory Visit: Payer: Self-pay | Admitting: Cardiology

## 2017-03-26 IMAGING — DX DG THORACIC SPINE 2V
4 series · 4 of 4 positions shown · non-contrast
Comparison: None

CLINICAL DATA: Mid back pain

EXAM:
THORACIC SPINE 2 VIEWS

[t-spine ap (1 of 3)]
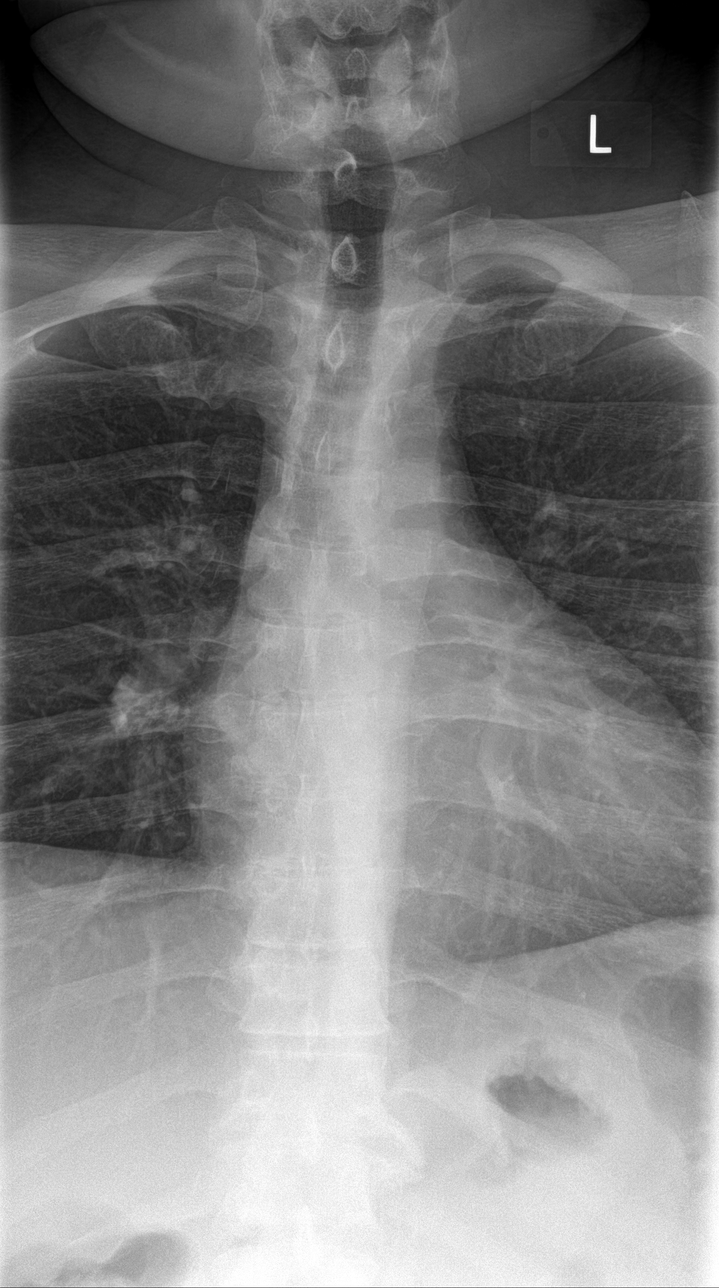

[t-spine lat]
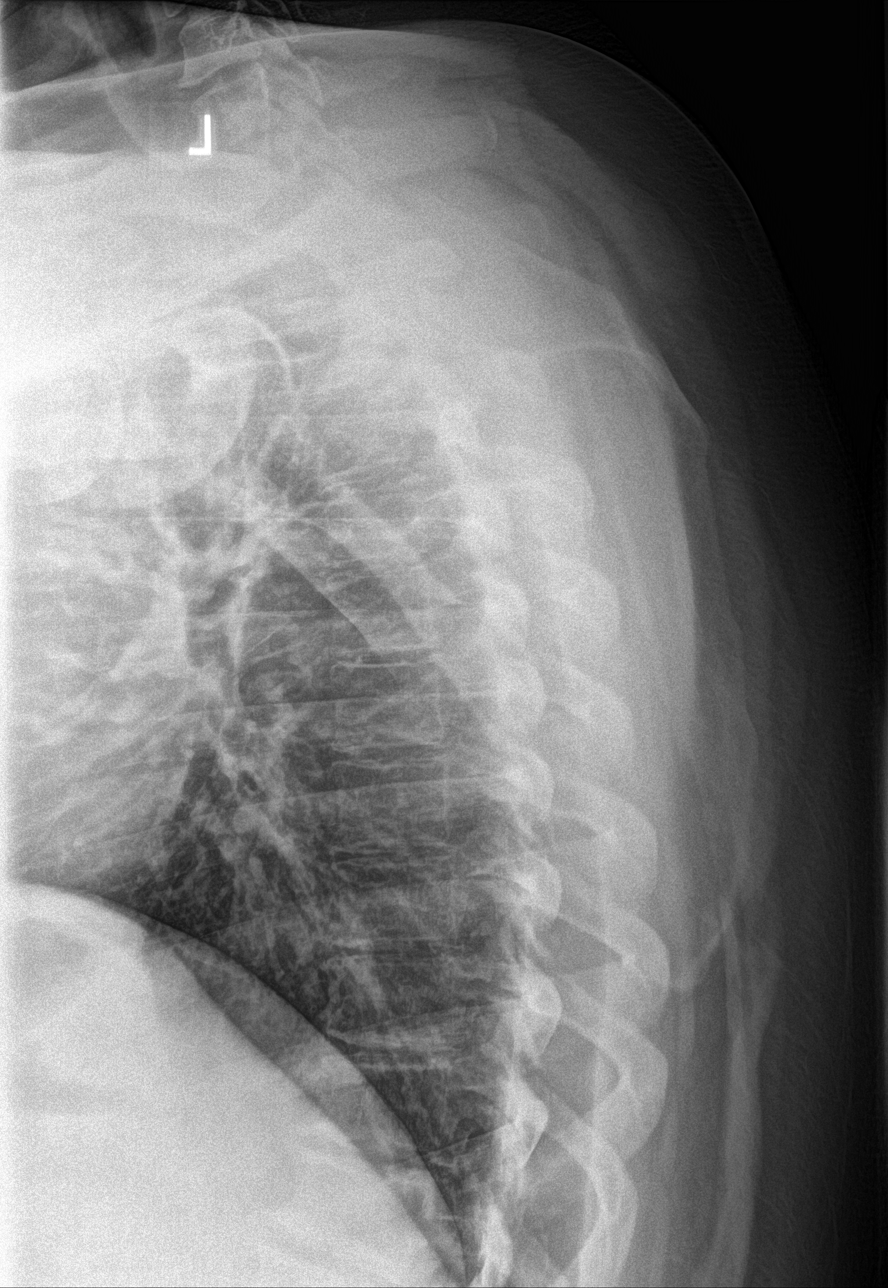

[t-spine ap (2 of 3)]
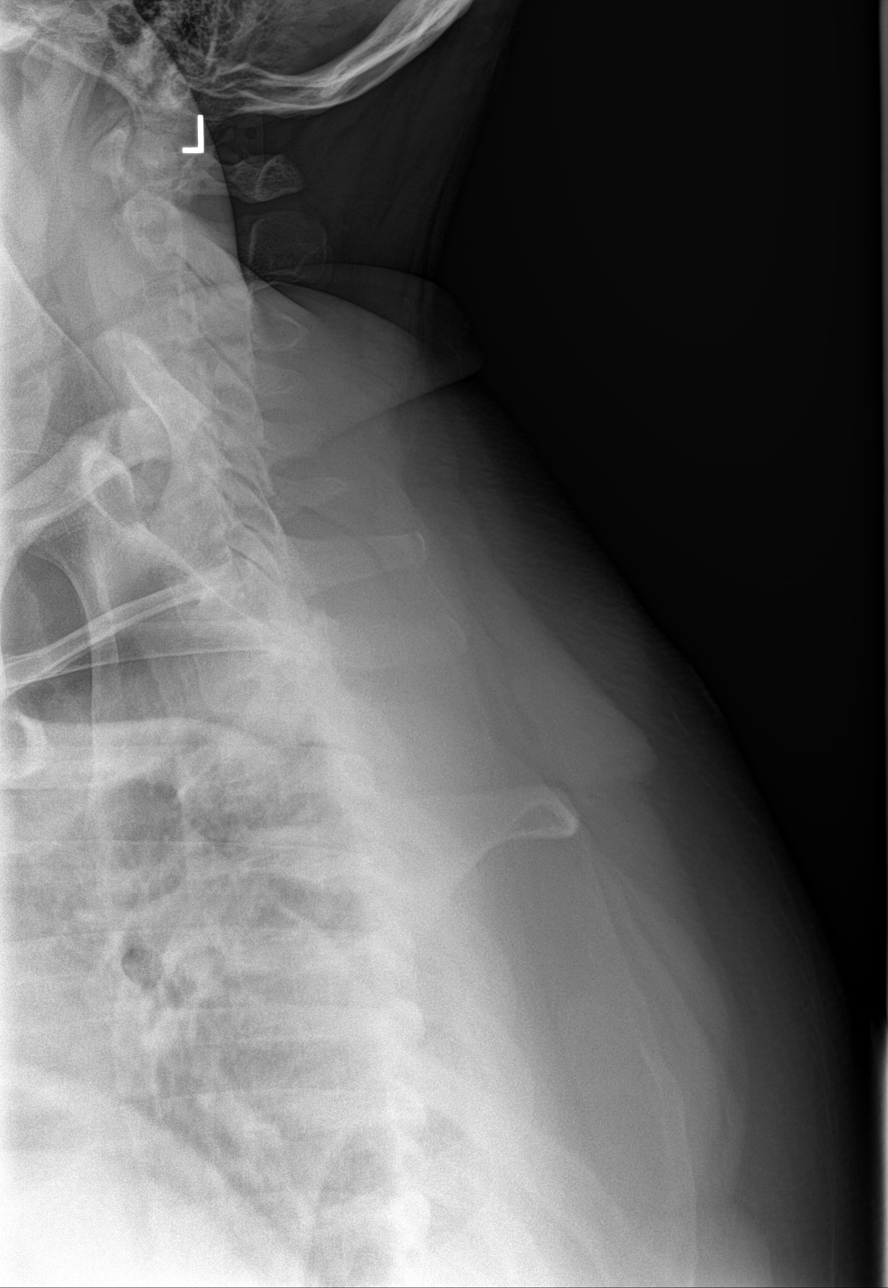

[t-spine ap (3 of 3)]
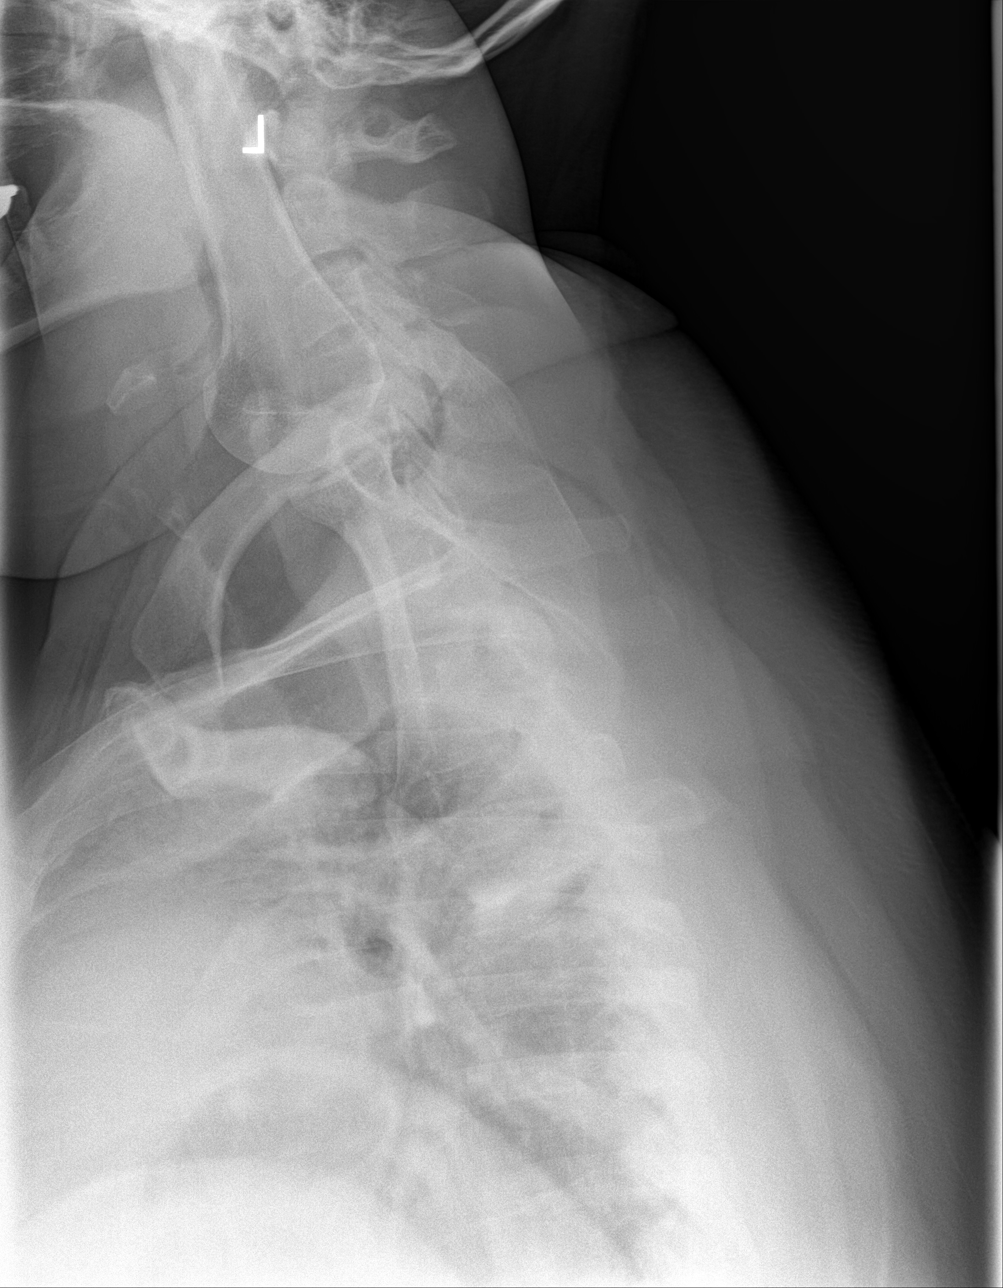

[4 of 4 positions shown; findings below may reference images not displayed]

FINDINGS: The thoracic vertebrae are in normal alignment. No acute compression
deformity is seen. There is some degenerative change at the T11-12
level.
IMPRESSION: No acute abnormality.  Degenerative change at T11-T12.

## 2017-07-19 ENCOUNTER — Telehealth: Payer: Self-pay | Admitting: Cardiology

## 2017-07-19 NOTE — Telephone Encounter (Signed)
Numerous attempts to contact patient with recall letters. Unable to reach by telephone. with no success.   06/21/2015 4:42 PM New [10]    [System] 03/23/2016 11:01 PM Notification Sent [20]   Jerry Warren [[id number]] 12/12/2016 1:23 PM Notification Sent [20]   Jerry Warren [[id number]] 01/19/2017 10:42 AM Notification Sent [20]   Jerry Warren [[id number]] 03/12/2017 8:52 AM Notification Sent [20]   Jerry Warren [[id number]] 07/19/2017 11:20 AM Notification Sent [20]

## 2017-08-16 ENCOUNTER — Encounter: Payer: Self-pay | Admitting: Family

## 2017-08-16 ENCOUNTER — Ambulatory Visit (INDEPENDENT_AMBULATORY_CARE_PROVIDER_SITE_OTHER): Payer: Self-pay | Admitting: Family

## 2017-08-16 VITALS — BP 136/96 | HR 84 | Temp 98.1°F | Ht 70.0 in | Wt 307.0 lb

## 2017-08-16 DIAGNOSIS — R635 Abnormal weight gain: Secondary | ICD-10-CM

## 2017-08-16 DIAGNOSIS — K2100 Gastro-esophageal reflux disease with esophagitis, without bleeding: Secondary | ICD-10-CM

## 2017-08-16 DIAGNOSIS — F17200 Nicotine dependence, unspecified, uncomplicated: Secondary | ICD-10-CM

## 2017-08-16 DIAGNOSIS — K219 Gastro-esophageal reflux disease without esophagitis: Secondary | ICD-10-CM | POA: Insufficient documentation

## 2017-08-16 DIAGNOSIS — I10 Essential (primary) hypertension: Secondary | ICD-10-CM | POA: Insufficient documentation

## 2017-08-16 MED ORDER — HYDROCHLOROTHIAZIDE 25 MG PO TABS: 25.0000 mg | ORAL_TABLET | Freq: Every day | ORAL | 1 refills | Status: DC

## 2017-08-16 MED ORDER — METOPROLOL TARTRATE 25 MG PO TABS: 25.0000 mg | ORAL_TABLET | Freq: Two times a day (BID) | ORAL | 2 refills | Status: DC

## 2017-08-16 NOTE — Progress Notes (Signed)
Subjective:    Patient ID: Jerry Warren, male    DOB: Jul 18, 1980, 37 y.o.   MRN: [id number]  Chief Complaint  Patient presents with  . check for thyroid problems    swollen are with tenderness in neck, weight gain   Pt presents to the office today with "tenderness in his thyroid". States this tenderness goes and goes. He reports he has gained over 20 lbs in the last year, but states he is more active this year than he has been in years.   He denies hair loss, constipation, diarrhea, dry skin, or extreme fatigue.  Hypertension  This is a chronic problem. The current episode started more than 1 year ago. The problem has been waxing and waning since onset. The problem is uncontrolled. Pertinent negatives include no malaise/fatigue, peripheral edema or shortness of breath. The current treatment provides mild improvement. There is no history of kidney disease, CAD/MI, CVA or heart failure.  Gastroesophageal Reflux  He complains of heartburn. He reports no dysphagia or no globus sensation. This is a chronic problem. The current episode started more than 1 year ago. The problem occurs occasionally. He has tried a histamine-2 antagonist for the symptoms. The treatment provided mild relief.      Review of Systems  Constitutional: Negative for malaise/fatigue.  Respiratory: Negative for shortness of breath.   Gastrointestinal: Positive for heartburn. Negative for dysphagia.  All other systems reviewed and are negative.      Objective:   Physical Exam  Constitutional: He is oriented to person, place, and time. He appears well-developed and well-nourished. No distress.  HENT:  Head: Normocephalic.  Right Ear: External ear normal.  Left Ear: External ear normal.  Mouth/Throat: Oropharynx is clear and moist.  Eyes: Pupils are equal, round, and reactive to light. Right eye exhibits no discharge. Left eye exhibits no discharge.  Neck: Normal range of motion. Neck supple. No thyromegaly  present.  Cardiovascular: Normal rate, regular rhythm, normal heart sounds and intact distal pulses.  No murmur heard. Pulmonary/Chest: Effort normal and breath sounds normal. No respiratory distress. He has no wheezes.  Abdominal: Soft. Bowel sounds are normal. He exhibits no distension. There is no tenderness.  Musculoskeletal: Normal range of motion. He exhibits no edema or tenderness.  Neurological: He is alert and oriented to person, place, and time. He has normal reflexes. No cranial nerve deficit.  Skin: Skin is warm and dry. No rash noted. No erythema.  Psychiatric: He has a normal mood and affect. His behavior is normal. Judgment and thought content normal.  Vitals reviewed.     BP (!) 151/104   Pulse 82   Temp 98.1 F (36.7 C) (Oral)   Ht _0  (1.778 m)   Wt (!) 307 lb (139.3 kg)   BMI 44.05 kg/m      Assessment & Plan:  Jerry Warren comes in today with chief complaint of check for thyroid problems (swollen are with tenderness in neck, weight gain)   Diagnosis and orders addressed:  1. Essential hypertension Will increase HCTZ to 25 mg from 12.5 mg  -Daily blood pressure log given with instructions on how to fill out and told to bring to next visit -Dash diet information given -Exercise encouraged - Stress Management  -Continue current meds - CMP14+EGFR - TSH - hydrochlorothiazide (HYDRODIURIL) 25 MG tablet; Take 1 tablet (25 mg total) by mouth daily.  Dispense: 90 tablet; Refill: 1 - metoprolol tartrate (LOPRESSOR) 25 MG tablet; Take 1 tablet (25  mg total) by mouth 2 (two) times daily.  Dispense: 180 tablet; Refill: 2  2. Gastroesophageal reflux disease with esophagitis - CMP14+EGFR  3. Weight gain - CMP14+EGFR - TSH  4. Smoker Smoking cessation discussed - CMP14+EGFR   Labs pending Health Maintenance reviewed Diet and exercise encouraged  Follow up plan: 6 months, wife to call BP readings    Evelina Dun, FNP

## 2017-08-16 NOTE — Patient Instructions (Signed)

## 2017-08-17 ENCOUNTER — Telehealth: Payer: Self-pay | Admitting: Family

## 2017-08-17 LAB — CMP14+EGFR
ALT: 28 IU/L (ref 0–44)
AST: 27 IU/L (ref 0–40)
Albumin/Globulin Ratio: 1.8 (ref 1.2–2.2)
Albumin: 4.9 g/dL (ref 3.5–5.5)
Alkaline Phosphatase: 92 IU/L (ref 39–117)
BUN/Creatinine Ratio: 10 (ref 9–20)
BUN: 14 mg/dL (ref 6–20)
Bilirubin Total: 0.4 mg/dL (ref 0.0–1.2)
CALCIUM: 9.3 mg/dL (ref 8.7–10.2)
CO2: 28 mmol/L (ref 20–29)
Chloride: 101 mmol/L (ref 96–106)
Creatinine, Ser: 1.42 mg/dL — ABNORMAL HIGH (ref 0.76–1.27)
GFR, EST AFRICAN AMERICAN: 72 mL/min/{1.73_m2} (ref >59)
GFR, EST NON AFRICAN AMERICAN: 62 mL/min/{1.73_m2} (ref >59)
GLUCOSE: 91 mg/dL (ref 65–99)
Globulin, Total: 2.7 g/dL (ref 1.5–4.5)
Potassium: 3.7 mmol/L (ref 3.5–5.2)
Sodium: 142 mmol/L (ref 134–144)
TOTAL PROTEIN: 7.6 g/dL (ref 6.0–8.5)

## 2017-08-17 LAB — TSH: TSH: 1.71 u[IU]/mL (ref 0.450–4.500)

## 2017-08-17 NOTE — Telephone Encounter (Signed)
Wife aware

## 2018-03-08 ENCOUNTER — Telehealth: Payer: Self-pay | Admitting: Family

## 2018-03-08 DIAGNOSIS — I10 Essential (primary) hypertension: Secondary | ICD-10-CM

## 2018-03-08 MED ORDER — HYDROCHLOROTHIAZIDE 25 MG PO TABS: 25.0000 mg | ORAL_TABLET | Freq: Every day | ORAL | 0 refills | Status: DC

## 2018-03-08 NOTE — Telephone Encounter (Signed)
Pt aware 30 day refill sent in appt made for 03/18/18

## 2018-03-08 NOTE — Telephone Encounter (Signed)
PT is wanting to know if he can get a refill on his hydrochlorothiazide (HYDRODIURIL) 25 MG tablet  Pharmacy [facility]

## 2018-03-18 ENCOUNTER — Encounter: Payer: Self-pay | Admitting: Family

## 2018-03-18 ENCOUNTER — Ambulatory Visit: Payer: Self-pay | Admitting: Family

## 2018-03-18 VITALS — BP 151/102 | HR 76 | Temp 98.0°F | Ht 70.0 in | Wt 315.0 lb

## 2018-03-18 DIAGNOSIS — I10 Essential (primary) hypertension: Secondary | ICD-10-CM

## 2018-03-18 DIAGNOSIS — K2100 Gastro-esophageal reflux disease with esophagitis, without bleeding: Secondary | ICD-10-CM

## 2018-03-18 MED ORDER — METOPROLOL TARTRATE 25 MG PO TABS: 25.0000 mg | ORAL_TABLET | Freq: Two times a day (BID) | ORAL | 2 refills | Status: DC

## 2018-03-18 MED ORDER — HYDROCHLOROTHIAZIDE 25 MG PO TABS: 25.0000 mg | ORAL_TABLET | Freq: Every day | ORAL | 2 refills | Status: DC

## 2018-03-18 MED ORDER — RANITIDINE HCL 150 MG PO TABS: 150.0000 mg | ORAL_TABLET | Freq: Two times a day (BID) | ORAL | 2 refills | Status: DC

## 2018-03-18 NOTE — Patient Instructions (Signed)

## 2018-03-18 NOTE — Progress Notes (Signed)
Subjective:    Patient ID: Jerry Warren, male    DOB: June 03, 1980, 38 y.o.   MRN: [id number]  Chief Complaint  Patient presents with  . Medical Management of Chronic Issues    six month recheck   Pt presents to the office today for chronic follow up. Pt's BP is elevated today, but states he forgot to take his medications yesterday.  Hypertension  This is a chronic problem. The current episode started more than 1 year ago. The problem has been waxing and waning since onset. The problem is uncontrolled. Pertinent negatives include no malaise/fatigue, peripheral edema or shortness of breath. Past treatments include diuretics and beta blockers. The current treatment provides mild improvement. There is no history of kidney disease, CAD/MI, CVA or heart failure.  Gastroesophageal Reflux  He reports no belching, no coughing or no heartburn. This is a chronic problem. The current episode started more than 1 year ago. The problem occurs occasionally. He has tried a histamine-2 antagonist for the symptoms. The treatment provided moderate relief.      Review of Systems  Constitutional: Negative for malaise/fatigue.  Respiratory: Negative for cough and shortness of breath.   Gastrointestinal: Negative for heartburn.  All other systems reviewed and are negative.      Objective:   Physical Exam Vitals signs reviewed.  Constitutional:      General: He is not in acute distress.    Appearance: He is well-developed. He is obese.  HENT:     Head: Normocephalic.     Right Ear: Tympanic membrane normal.     Left Ear: Tympanic membrane normal.  Eyes:     General:        Right eye: No discharge.        Left eye: No discharge.     Pupils: Pupils are equal, round, and reactive to light.  Neck:     Musculoskeletal: Normal range of motion and neck supple.     Thyroid: No thyromegaly.  Cardiovascular:     Rate and Rhythm: Normal rate and regular rhythm.     Heart sounds: Normal heart sounds. No  murmur.  Pulmonary:     Effort: Pulmonary effort is normal. No respiratory distress.     Breath sounds: Normal breath sounds. No wheezing.  Abdominal:     General: Bowel sounds are normal. There is no distension.     Palpations: Abdomen is soft.     Tenderness: There is no abdominal tenderness.  Musculoskeletal: Normal range of motion.        General: No tenderness.  Skin:    General: Skin is warm and dry.     Findings: No erythema or rash.  Neurological:     Mental Status: He is alert and oriented to person, place, and time.     Cranial Nerves: No cranial nerve deficit.     Deep Tendon Reflexes: Reflexes are normal and symmetric.  Psychiatric:        Behavior: Behavior normal.        Thought Content: Thought content normal.        Judgment: Judgment normal.       BP (!) 151/102   Pulse 76   Temp 98 F (36.7 C) (Oral)   Ht _0  (1.778 m)   Wt (!) 315 lb (142.9 kg)   BMI 45.20 kg/m      Assessment & Plan:  JERMICHAEL BELMARES comes in today with chief complaint of Medical Management of Chronic Issues (  six month recheck)   Diagnosis and orders addressed:  1. Essential hypertension -Daily blood pressure log given with instructions on how to fill out and told to bring to next visit -Dash diet information given -Exercise encouraged - Stress Management  -Continue current meds -RTO in 2-3 weeks to have nurse recheck BP  - hydrochlorothiazide (HYDRODIURIL) 25 MG tablet; Take 1 tablet (25 mg total) by mouth daily. (Needs to be seen before next refill)  Dispense: 90 tablet; Refill: 2 - metoprolol tartrate (LOPRESSOR) 25 MG tablet; Take 1 tablet (25 mg total) by mouth 2 (two) times daily.  Dispense: 180 tablet; Refill: 2 - CMP14+EGFR  2. Gastroesophageal reflux disease with esophagitis -Diet discussed- Avoid fried, spicy, citrus foods, caffeine and alcohol -Do not eat 2-3 hours before bedtime -Encouraged small frequent meals -Avoid NSAID's - CMP14+EGFR  3. Morbid  obesity ([place]) - CMP14+EGFR   Health Maintenance reviewed Diet and exercise encouraged  Follow up plan: 6 months    Evelina Dun, FNP

## 2018-03-19 ENCOUNTER — Telehealth: Payer: Self-pay | Admitting: Family

## 2018-03-19 LAB — CMP14+EGFR
ALT: 23 IU/L (ref 0–44)
AST: 22 IU/L (ref 0–40)
Albumin/Globulin Ratio: 1.8 (ref 1.2–2.2)
Albumin: 4.7 g/dL (ref 4.0–5.0)
Alkaline Phosphatase: 88 IU/L (ref 39–117)
BUN / CREAT RATIO: 9 (ref 9–20)
BUN: 11 mg/dL (ref 6–20)
Bilirubin Total: 0.3 mg/dL (ref 0.0–1.2)
CO2: 23 mmol/L (ref 20–29)
Calcium: 9.1 mg/dL (ref 8.7–10.2)
Chloride: 100 mmol/L (ref 96–106)
Creatinine, Ser: 1.29 mg/dL — ABNORMAL HIGH (ref 0.76–1.27)
GFR calc Af Amer: 80 mL/min/{1.73_m2} (ref >59)
GFR calc non Af Amer: 69 mL/min/{1.73_m2} (ref >59)
Globulin, Total: 2.6 g/dL (ref 1.5–4.5)
Glucose: 86 mg/dL (ref 65–99)
Potassium: 4.3 mmol/L (ref 3.5–5.2)
SODIUM: 142 mmol/L (ref 134–144)
Total Protein: 7.3 g/dL (ref 6.0–8.5)

## 2018-03-19 NOTE — Telephone Encounter (Signed)
Aware of results. 

## 2018-06-06 ENCOUNTER — Ambulatory Visit: Payer: Self-pay | Admitting: Family

## 2018-06-06 ENCOUNTER — Other Ambulatory Visit: Payer: Self-pay

## 2018-08-22 ENCOUNTER — Ambulatory Visit: Payer: Self-pay | Admitting: Family

## 2019-01-12 ENCOUNTER — Other Ambulatory Visit: Payer: Self-pay | Admitting: Family

## 2019-01-12 DIAGNOSIS — I10 Essential (primary) hypertension: Secondary | ICD-10-CM

## 2019-04-20 ENCOUNTER — Other Ambulatory Visit: Payer: Self-pay | Admitting: Family

## 2019-04-20 DIAGNOSIS — I10 Essential (primary) hypertension: Secondary | ICD-10-CM

## 2019-04-21 NOTE — Telephone Encounter (Signed)
Last seen 02/2018

## 2019-08-19 ENCOUNTER — Other Ambulatory Visit: Payer: Self-pay

## 2019-08-19 ENCOUNTER — Emergency Department (HOSPITAL_COMMUNITY)
Admission: EM | Admit: 2019-08-19 | Discharge: 2019-08-19 | Disposition: A | Discharge status: Home / Self Care | Payer: Self-pay | Attending: Emergency Medicine | Admitting: Emergency Medicine

## 2019-08-19 ENCOUNTER — Encounter (HOSPITAL_COMMUNITY): Payer: Self-pay | Admitting: Emergency Medicine

## 2019-08-19 ENCOUNTER — Emergency Department (HOSPITAL_COMMUNITY): Payer: Self-pay

## 2019-08-19 DIAGNOSIS — F17210 Nicotine dependence, cigarettes, uncomplicated: Secondary | ICD-10-CM | POA: Insufficient documentation

## 2019-08-19 DIAGNOSIS — Z7982 Long term (current) use of aspirin: Secondary | ICD-10-CM | POA: Insufficient documentation

## 2019-08-19 DIAGNOSIS — I10 Essential (primary) hypertension: Secondary | ICD-10-CM | POA: Insufficient documentation

## 2019-08-19 DIAGNOSIS — Z79899 Other long term (current) drug therapy: Secondary | ICD-10-CM | POA: Insufficient documentation

## 2019-08-19 LAB — CBC WITH DIFFERENTIAL/PLATELET
Abs Immature Granulocytes: 0.03 10*3/uL (ref 0.00–0.07)
Basophils Absolute: 0 10*3/uL (ref 0.0–0.1)
Basophils Relative: 0 %
Eosinophils Absolute: 0.1 10*3/uL (ref 0.0–0.5)
Eosinophils Relative: 1 %
HCT: 46.5 % (ref 39.0–52.0)
Hemoglobin: 14.8 g/dL (ref 13.0–17.0)
Immature Granulocytes: 0 %
Lymphocytes Relative: 33 %
Lymphs Abs: 3.3 10*3/uL (ref 0.7–4.0)
MCH: 29.4 pg (ref 26.0–34.0)
MCHC: 31.8 g/dL (ref 30.0–36.0)
MCV: 92.4 fL (ref 80.0–100.0)
Monocytes Absolute: 0.5 10*3/uL (ref 0.1–1.0)
Monocytes Relative: 5 %
Neutro Abs: 5.9 10*3/uL (ref 1.7–7.7)
Neutrophils Relative %: 61 %
Platelets: 221 10*3/uL (ref 150–400)
RBC: 5.03 MIL/uL (ref 4.22–5.81)
RDW: 14.7 % (ref 11.5–15.5)
WBC: 9.8 10*3/uL (ref 4.0–10.5)
nRBC: 0 % (ref 0.0–0.2)

## 2019-08-19 LAB — COMPREHENSIVE METABOLIC PANEL
ALT: 37 U/L (ref 0–44)
AST: 28 U/L (ref 15–41)
Albumin: 4.3 g/dL (ref 3.5–5.0)
Alkaline Phosphatase: 71 U/L (ref 38–126)
Anion gap: 13 (ref 5–15)
BUN: 14 mg/dL (ref 6–20)
CO2: 28 mmol/L (ref 22–32)
Calcium: 9.2 mg/dL (ref 8.9–10.3)
Chloride: 98 mmol/L (ref 98–111)
Creatinine, Ser: 1.41 mg/dL — ABNORMAL HIGH (ref 0.61–1.24)
GFR calc Af Amer: >60 mL/min (ref >60)
GFR calc non Af Amer: >60 mL/min (ref >60)
Glucose, Bld: 95 mg/dL (ref 70–99)
Potassium: 3.5 mmol/L (ref 3.5–5.1)
Sodium: 139 mmol/L (ref 135–145)
Total Bilirubin: 0.7 mg/dL (ref 0.3–1.2)
Total Protein: 7.9 g/dL (ref 6.5–8.1)

## 2019-08-19 LAB — TROPONIN I (HIGH SENSITIVITY): Troponin I (High Sensitivity): 5 ng/L (ref <18)

## 2019-08-19 NOTE — ED Triage Notes (Signed)
Pt reports hx of HTN, checked v/s today 3 different times with BP in 150-170s/100; HR was low in 50s; pt is on BP meds and has made appt with PCP for follow-up, pt also reports feeling weak and fatigued

## 2019-08-19 NOTE — Discharge Instructions (Addendum)
See your Physician for recheck of your blood pressure  

## 2019-08-20 NOTE — ED Provider Notes (Signed)
[facility] EMERGENCY DEPARTMENT Provider Note   CSN: [id number] Arrival date & time: 08/19/19  1640     History Chief Complaint  Patient presents with  . Hypertension    Jerry Warren is a 39 y.o. male.  Patient complains of some soreness to his right chest tonight.  Patient has been taking his blood pressure blood pressure has been elevated at home.  He reports his blood pressure was elevated and his heart rate was 50 patient is concerned.  And is on hydrochlorothiazide and metoprolol for his blood pressure.  He has not noticed his heart rate being low in the past  The history is provided by the patient. No language interpreter was used.  Hypertension This is a recurrent problem. The current episode started 12 to 24 hours ago. The problem occurs constantly. The problem has been rapidly worsening. Pertinent negatives include no chest pain. Nothing aggravates the symptoms. Nothing relieves the symptoms. He has tried nothing for the symptoms.       Past Medical History:  Diagnosis Date  . Herniated disc   . Hypertension     Patient Active Problem List   Diagnosis Date Noted  . Morbid obesity (HCC) 03/18/2018  . Hypertension 08/16/2017  . GERD (gastroesophageal reflux disease) 08/16/2017  . Palpitations 08/29/2013  . 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 MULTIPLE TOOTH EXTRACTIONS         Family History  Problem Relation Age of Onset  . Diabetes Father   . Hypertension Father     Social History   Tobacco Use  . Smoking status: Former Smoker    Packs/day: 1.00    Years: 19.00    Pack years: 19.00    Types: Cigarettes    Start date: 11/04/1994    Quit date: 08/24/2013    Years since quitting: 5.9  . Smokeless tobacco: Never Used  Vaping Use  . Vaping Use: Never used  Substance Use Topics  . Alcohol use: No    Alcohol/week: 0.0 standard drinks  . Drug use: No    Home Medications Prior to Admission  medications   Medication Sig Start Date End Date Taking? Authorizing Provider  aspirin 81 MG chewable tablet Chew 81 mg by mouth daily.   Yes [provider]  famotidine (PEPCID) 20 MG tablet Take 20 mg by mouth in the morning and at bedtime.   Yes [provider]  hydrochlorothiazide (HYDRODIURIL) 25 MG tablet Take 1 tablet by mouth once daily Patient taking differently: Take 25 mg by mouth daily.  01/13/19  Yes Hawks, Christy A, FNP  metoprolol tartrate (LOPRESSOR) 25 MG tablet Take 1 tablet by mouth twice daily Patient taking differently: Take 25 mg by mouth 2 (two) times daily.  01/13/19  Yes Junie Spencer, FNP    Allergies    Patient has no known allergies.  Review of Systems   Review of Systems  Cardiovascular: Negative for chest pain.  All other systems reviewed and are negative.   Physical Exam Updated Vital Signs BP 132/79 (BP Location: Right Arm)   Pulse 61   Temp 98.2 F (36.8 C) (Oral)   Resp 15   Ht 5\' 10"  (1.778 m)   Wt (!) 145.2 kg   SpO2 99%   BMI 45.92 kg/m   Physical Exam Vitals and nursing note reviewed.  Constitutional:      Appearance: He is well-developed.  HENT:     Head: Normocephalic  and atraumatic.  Eyes:     Conjunctiva/sclera: Conjunctivae normal.  Cardiovascular:     Rate and Rhythm: Normal rate and regular rhythm.     Heart sounds: No murmur heard.   Pulmonary:     Effort: Pulmonary effort is normal. No respiratory distress.     Breath sounds: Normal breath sounds.  Abdominal:     Palpations: Abdomen is soft.     Tenderness: There is no abdominal tenderness.  Musculoskeletal:        General: Normal range of motion.     Cervical back: Neck supple.  Skin:    General: Skin is warm and dry.  Neurological:     General: No focal deficit present.     Mental Status: He is alert.  Psychiatric:        Mood and Affect: Mood normal.     ED Results / Procedures / Treatments   Labs (all labs ordered are listed, but  only abnormal results are displayed) Labs Reviewed  COMPREHENSIVE METABOLIC PANEL - Abnormal; Notable for the following components:      Result Value   Creatinine, Ser 1.41 (*)    All other components within normal limits  CBC WITH DIFFERENTIAL/PLATELET  TROPONIN I (HIGH SENSITIVITY)    EKG None  Radiology DG Chest Portable 1 View  Result Date: 08/19/2019 CLINICAL DATA:  Hypertension EXAM: PORTABLE CHEST 1 VIEW COMPARISON:  None. FINDINGS: Lungs are clear. Heart is upper normal in size with pulmonary vascularity normal. No adenopathy. No bone lesions. IMPRESSION: Lungs clear.  Heart upper normal in size. Electronically Signed   By: Bretta Bang III M.D.   On: 08/19/2019 20:40    Procedures Procedures (including critical care time)  Medications Ordered in ED Medications - No data to display  ED Course  I have reviewed the triage vital signs and the nursing notes.  Pertinent labs & imaging results that were available during my care of the patient were reviewed by me and considered in my medical decision making (see chart for details).    MDM Rules/Calculators/A&P                          MDM: EKG nonacute chest x-ray is normal troponin is negative.  Patient's blood pressure 132/79.  Patient is counseled on hypertension management he is advised to increase exercise.  Decrease salt/sodium intake and to new taking his medications follow-up with his primary care physician.  Final Clinical Impression(s) / ED Diagnoses Final diagnoses:  Hypertension, unspecified type    Rx / DC Orders ED Discharge Orders    None    An After Visit Summary was printed and given to the patient.    Elson Areas, Cordelia Poche 08/20/19 2345    Bethann Berkshire, MD 08/22/19 1220

## 2019-08-21 ENCOUNTER — Other Ambulatory Visit: Payer: Self-pay

## 2019-08-21 ENCOUNTER — Ambulatory Visit (INDEPENDENT_AMBULATORY_CARE_PROVIDER_SITE_OTHER): Payer: Self-pay | Admitting: Family

## 2019-08-21 ENCOUNTER — Encounter: Payer: Self-pay | Admitting: Family

## 2019-08-21 VITALS — BP 125/90 | HR 76 | Temp 98.2°F | Ht 70.0 in | Wt 310.4 lb

## 2019-08-21 DIAGNOSIS — Z09 Encounter for follow-up examination after completed treatment for conditions other than malignant neoplasm: Secondary | ICD-10-CM

## 2019-08-21 DIAGNOSIS — I10 Essential (primary) hypertension: Secondary | ICD-10-CM

## 2019-08-21 MED ORDER — METOPROLOL TARTRATE 25 MG PO TABS: 25.0000 mg | ORAL_TABLET | Freq: Two times a day (BID) | ORAL | 2 refills | Status: DC

## 2019-08-21 MED ORDER — HYDROCHLOROTHIAZIDE 25 MG PO TABS: 25.0000 mg | ORAL_TABLET | Freq: Every day | ORAL | 2 refills | Status: DC

## 2019-08-21 NOTE — Progress Notes (Signed)
Subjective:    Patient ID: Jerry Warren, male    DOB: 05/31/1980, 39 y.o.   MRN: [id number]  Chief Complaint  Patient presents with   Hypertension    [place] yesterday, works in heat and on feet all day, EKG done at [employer] [facility]   Pt presents to the office today to recheck HTN. He states his BP was elevated and his pulse was lower. He went to the ED. He had a negative EKG, chest xray, and troponin negative.  Hypertension This is a chronic problem. The current episode started more than 1 year ago. The problem has been waxing and waning since onset. The problem is controlled. Pertinent negatives include no blurred vision, headaches, malaise/fatigue, peripheral edema or shortness of breath. Risk factors for coronary artery disease include obesity, male gender and sedentary lifestyle. Past treatments include diuretics and beta blockers. The current treatment provides moderate improvement. There is no history of kidney disease, CVA or heart failure.      Review of Systems  Constitutional: Negative for malaise/fatigue.  Eyes: Negative for blurred vision.  Respiratory: Negative for shortness of breath.   Neurological: Negative for headaches.  All other systems reviewed and are negative.      Objective:   Physical Exam Vitals reviewed.  Constitutional:      General: He is not in acute distress.    Appearance: He is well-developed. He is obese.  HENT:     Head: Normocephalic.     Right Ear: Tympanic membrane normal.     Left Ear: Tympanic membrane normal.  Eyes:     General:        Right eye: No discharge.        Left eye: No discharge.     Pupils: Pupils are equal, round, and reactive to light.  Neck:     Thyroid: No thyromegaly.  Cardiovascular:     Rate and Rhythm: Normal rate and regular rhythm.     Heart sounds: Normal heart sounds. No murmur heard.   Pulmonary:     Effort: Pulmonary effort is normal. No respiratory distress.     Breath sounds: Normal breath sounds.  No wheezing.  Abdominal:     General: Bowel sounds are normal. There is no distension.     Palpations: Abdomen is soft.     Tenderness: There is no abdominal tenderness.  Musculoskeletal:        General: No tenderness. Normal range of motion.     Cervical back: Normal range of motion and neck supple.  Skin:    General: Skin is warm and dry.     Findings: No erythema or rash.  Neurological:     Mental Status: He is alert and oriented to person, place, and time.     Cranial Nerves: No cranial nerve deficit.     Deep Tendon Reflexes: Reflexes are normal and symmetric.  Psychiatric:        Behavior: Behavior normal.        Thought Content: Thought content normal.        Judgment: Judgment normal.       BP 125/90    Pulse 76    Temp 98.2 F (36.8 C) (Temporal)    Ht 5\' 10"  (1.778 m)    Wt (!) 310 lb 6.4 oz (140.8 kg)    BMI 44.54 kg/m      Assessment & Plan:  Jerry Warren comes in today with chief complaint of Hypertension ([place] yesterday,  works in heat and on feet all day, EKG done at [employer])   Diagnosis and orders addressed:  1. Essential hypertension Home readings stable -Dash diet information given -Exercise encouraged - Stress Management  -Continue current meds - hydrochlorothiazide (HYDRODIURIL) 25 MG tablet; Take 1 tablet (25 mg total) by mouth daily.  Dispense: 90 tablet; Refill: 2 - metoprolol tartrate (LOPRESSOR) 25 MG tablet; Take 1 tablet (25 mg total) by mouth 2 (two) times daily.  Dispense: 180 tablet; Refill: 2  2. Morbid obesity (HCC)  3. Hospital discharge follow-up Notes reviewed    Health Maintenance reviewed Diet and exercise encouraged  Follow up plan: 9 months, call if symptoms worsen or do not improve    Jannifer Rodney, FNP

## 2019-08-21 NOTE — Patient Instructions (Signed)

## 2019-08-26 ENCOUNTER — Telehealth: Payer: Self-pay | Admitting: Family

## 2019-08-26 MED ORDER — LISINOPRIL 20 MG PO TABS
20.0000 mg | ORAL_TABLET | Freq: Every day | ORAL | 3 refills | Status: DC
Start: 2019-08-26 — End: 2019-09-16

## 2019-08-26 NOTE — Telephone Encounter (Signed)
Lisinopril 20 mg Prescription sent to pharmacy, he will take this with his other medications. He needs a follow up with me in 2-4 weeks.

## 2019-08-26 NOTE — Telephone Encounter (Signed)
Patient aware and verbalized understanding. °

## 2019-09-05 ENCOUNTER — Other Ambulatory Visit: Payer: Self-pay

## 2019-09-05 ENCOUNTER — Other Ambulatory Visit: Payer: Medicaid Other

## 2019-09-05 ENCOUNTER — Telehealth: Payer: Self-pay | Admitting: *Deleted

## 2019-09-05 DIAGNOSIS — W57XXXA Bitten or stung by nonvenomous insect and other nonvenomous arthropods, initial encounter: Secondary | ICD-10-CM

## 2019-09-05 NOTE — Telephone Encounter (Signed)
Pt aware.

## 2019-09-05 NOTE — Telephone Encounter (Signed)
Pt would like orders placed for him to have blood drawn for Lymes and RMSF as his wife was just positive for RMSF and they both had tick bites and he is concerned now.

## 2019-09-05 NOTE — Telephone Encounter (Signed)
Labs ordered.

## 2019-09-08 ENCOUNTER — Telehealth: Payer: Self-pay | Admitting: *Deleted

## 2019-09-08 ENCOUNTER — Telehealth: Payer: Self-pay | Admitting: Family

## 2019-09-08 DIAGNOSIS — M545 Low back pain, unspecified: Secondary | ICD-10-CM

## 2019-09-08 LAB — ROCKY MTN SPOTTED FVR ABS PNL(IGG+IGM)
RMSF IgG: NEGATIVE
RMSF IgM: 0.27 index (ref 0.00–0.89)

## 2019-09-08 LAB — LYME AB/WESTERN BLOT REFLEX
LYME DISEASE AB, QUANT, IGM: <0.8 index (ref 0.00–0.79)
Lyme IgG/IgM Ab: <0.91 {ISR} (ref 0.00–0.90)

## 2019-09-08 NOTE — Telephone Encounter (Signed)
Aware of recommendations.  Bp has 120s and 130s most days.

## 2019-09-08 NOTE — Telephone Encounter (Signed)
Patient aware.

## 2019-09-08 NOTE — Telephone Encounter (Signed)
Patient is now complaining of burning and stinging with urination, low back pain and upper back pain since taking the lisinopril.  Advised patient that he would need to be seen, patient continued to decline and doesn't understand why he would need to be seen because of issues he is having since starting new medication.  Advised patient that blood work, urine, and possible xray would need to be done to rule out other problems.  Patient states that he doesn't understand why we need to take his money to change his prescription.  Again advised patient that he would need to be seen.  Patient continued to decline appt

## 2019-09-08 NOTE — Telephone Encounter (Signed)
Have patient come and leave a urine given current symptoms.

## 2019-09-08 NOTE — Telephone Encounter (Signed)
Pt states he has been taking the lisinopril (ZESTRIL) 20 MG tablet  And now having upper and lower back pain. States eating blueberries help ease the pain. Requesting to speak with the nurse regarding.

## 2019-09-08 NOTE — Telephone Encounter (Signed)
I think it may be coincidence that your back pain started after starting the lisinopril. I would recommend to continue eating blueberries. What is your BP running?

## 2019-09-09 ENCOUNTER — Other Ambulatory Visit: Payer: Medicaid Other

## 2019-09-09 ENCOUNTER — Other Ambulatory Visit: Payer: Self-pay

## 2019-09-09 LAB — URINALYSIS, COMPLETE
Bilirubin, UA: NEGATIVE
Glucose, UA: NEGATIVE
Ketones, UA: NEGATIVE
Nitrite, UA: NEGATIVE
RBC, UA: NEGATIVE
Specific Gravity, UA: 1.015 (ref 1.005–1.030)
Urobilinogen, Ur: 0.2 mg/dL (ref 0.2–1.0)
pH, UA: 5.5 (ref 5.0–7.5)

## 2019-09-09 LAB — MICROSCOPIC EXAMINATION
Bacteria, UA: NONE SEEN
Epithelial Cells (non renal): NONE SEEN /hpf (ref 0–10)
RBC, Urine: NONE SEEN /hpf (ref 0–2)
Renal Epithel, UA: NONE SEEN /hpf

## 2019-09-10 LAB — URINE CULTURE: Organism ID, Bacteria: NO GROWTH

## 2019-09-15 ENCOUNTER — Telehealth: Payer: Self-pay | Admitting: Family

## 2019-09-16 MED ORDER — LOSARTAN POTASSIUM 50 MG PO TABS
50.0000 mg | ORAL_TABLET | Freq: Every day | ORAL | 3 refills | Status: DC
Start: 2019-09-16 — End: 2020-11-05

## 2019-09-16 NOTE — Telephone Encounter (Signed)
Becky made aware of change in medication. Follow scheduled for 10/15/2019 at 3:25

## 2019-09-16 NOTE — Telephone Encounter (Signed)
Losartan 50 mg Prescription sent to pharmacy. Needs a follow up appt in 2-4 weeks.

## 2019-10-07 ENCOUNTER — Telehealth: Payer: Self-pay | Admitting: *Deleted

## 2019-10-07 MED ORDER — BUSPIRONE HCL 5 MG PO TABS: 5.0000 mg | ORAL_TABLET | Freq: Three times a day (TID) | ORAL | 2 refills | Status: DC | PRN

## 2019-10-07 NOTE — Telephone Encounter (Signed)
Patient called in today stating he had panic attack yesterday. He is really anxious. Patient went to ER on the 15th with chest pain. Patient wants to know if you have any recommendations to help with the panic attacks. He was requesting to see you today. He is aware that you do not have any appointments today. He has an appointment with you on the 25th.

## 2019-10-07 NOTE — Telephone Encounter (Signed)
Patient aware.

## 2019-10-07 NOTE — Telephone Encounter (Signed)
We will add Buspar 5 mg TID prn, to help with him his panic attacks to make it til his appointment.

## 2019-10-09 ENCOUNTER — Encounter: Payer: Self-pay | Admitting: Family

## 2019-10-09 ENCOUNTER — Ambulatory Visit (INDEPENDENT_AMBULATORY_CARE_PROVIDER_SITE_OTHER): Payer: Self-pay | Admitting: Family

## 2019-10-09 ENCOUNTER — Other Ambulatory Visit: Payer: Self-pay

## 2019-10-09 VITALS — BP 130/79 | HR 68 | Temp 96.9°F | Ht 70.0 in | Wt 301.6 lb

## 2019-10-09 DIAGNOSIS — F411 Generalized anxiety disorder: Secondary | ICD-10-CM

## 2019-10-09 DIAGNOSIS — R0789 Other chest pain: Secondary | ICD-10-CM

## 2019-10-09 DIAGNOSIS — Z09 Encounter for follow-up examination after completed treatment for conditions other than malignant neoplasm: Secondary | ICD-10-CM

## 2019-10-09 DIAGNOSIS — F410 Panic disorder [episodic paroxysmal anxiety] without agoraphobia: Secondary | ICD-10-CM

## 2019-10-09 MED ORDER — BACLOFEN 10 MG PO TABS: 10.0000 mg | ORAL_TABLET | Freq: Three times a day (TID) | ORAL | 3 refills | Status: DC

## 2019-10-09 MED ORDER — BUSPIRONE HCL 5 MG PO TABS: 5.0000 mg | ORAL_TABLET | Freq: Three times a day (TID) | ORAL | 2 refills | Status: DC | PRN

## 2019-10-09 NOTE — Patient Instructions (Signed)
Costochondritis  Costochondritis is swelling and irritation (inflammation) of the tissue (cartilage) that connects your ribs to your breastbone (sternum). This causes pain in the front of your chest. The pain usually starts gradually and involves more than one rib. What are the causes? The exact cause of this condition is not always known. It results from stress on the cartilage where your ribs attach to your sternum. The cause of this stress could be:  Chest injury (trauma).  Exercise or activity, such as lifting.  Severe coughing. What increases the risk? You may be at higher risk for this condition if you:  Are male.  Are 30?40 years old.  Recently started a new exercise or work activity.  Have low levels of vitamin D.  Have a condition that makes you cough frequently. What are the signs or symptoms? The main symptom of this condition is chest pain. The pain:  Usually starts gradually and can be sharp or dull.  Gets worse with deep breathing, coughing, or exercise.  Gets better with rest.  May be worse when you press on the sternum-rib connection (tenderness). How is this diagnosed? This condition is diagnosed based on your symptoms, medical history, and a physical exam. Your health care provider will check for tenderness when pressing on your sternum. This is the most important finding. You may also have tests to rule out other causes of chest pain. These may include:  A chest X-ray to check for lung problems.  An electrocardiogram (ECG) to see if you have a heart problem that could be causing the pain.  An imaging scan to rule out a chest or rib fracture. How is this treated? This condition usually goes away on its own over time. Your health care provider may prescribe an NSAID to reduce pain and inflammation. Your health care provider may also suggest that you:  Rest and avoid activities that make pain worse.  Apply heat or cold to the area to reduce pain and  inflammation.  Do exercises to stretch your chest muscles. If these treatments do not help, your health care provider may inject a numbing medicine at the sternum-rib connection to help relieve the pain. Follow these instructions at home:  Avoid activities that make pain worse. This includes any activities that use chest, abdominal, and side muscles.  If directed, put ice on the painful area: ? Put ice in a plastic bag. ? Place a towel between your skin and the bag. ? Leave the ice on for 20 minutes, 2-3 times a day.  If directed, apply heat to the affected area as often as told by your health care provider. Use the heat source that your health care provider recommends, such as a moist heat pack or a heating pad. ? Place a towel between your skin and the heat source. ? Leave the heat on for 20-30 minutes. ? Remove the heat if your skin turns bright red. This is especially important if you are unable to feel pain, heat, or cold. You may have a greater risk of getting burned.  Take over-the-counter and prescription medicines only as told by your health care provider.  Return to your normal activities as told by your health care provider. Ask your health care provider what activities are safe for you.  Keep all follow-up visits as told by your health care provider. This is important. Contact a health care provider if:  You have chills or a fever.  Your pain does not go away or it gets   worse.  You have a cough that does not go away (is persistent). Get help right away if:  You have shortness of breath. This information is not intended to replace advice given to you by your health care provider. Make sure you discuss any questions you have with your health care provider. Document Revised: 02/21/2017 Document Reviewed: 06/02/2015 Elsevier Patient Education  2020 [employer]  

## 2019-10-09 NOTE — Progress Notes (Signed)
Subjective:    Patient ID: Jerry Warren, male    DOB: 04-04-1980, 39 y.o.   MRN: [id number]  Chief Complaint  Patient presents with  . Panic Attack  . chest wall pain    wants some type of medication   PT presents to the office today with increased GAD. He went to the ED on 10/05/19 with chest pain. He had a negative CT chest angiogram that was negative. EKG that showed sinus bradycardia.    He was diagnosed with chest wall pain. He is taking motrin as needed. Denies any injury.   He was given Buspar 5 mg TID and states he has not had a panic attack since.  Anxiety Presents for initial visit. Onset was 1 to 6 months ago. The problem has been waxing and waning. Symptoms include chest pain, excessive worry and panic. Patient reports no dry mouth or restlessness. Symptoms occur most days.        Review of Systems  Cardiovascular: Positive for chest pain.  All other systems reviewed and are negative.      Objective:   Physical Exam Vitals reviewed.  Constitutional:      General: He is not in acute distress.    Appearance: He is well-developed. He is obese.  HENT:     Head: Normocephalic.  Eyes:     General:        Right eye: No discharge.        Left eye: No discharge.     Pupils: Pupils are equal, round, and reactive to light.  Neck:     Thyroid: No thyromegaly.  Cardiovascular:     Rate and Rhythm: Normal rate and regular rhythm.     Heart sounds: Normal heart sounds. No murmur heard.   Pulmonary:     Effort: Pulmonary effort is normal. No respiratory distress.     Breath sounds: Normal breath sounds. No wheezing.  Abdominal:     General: Bowel sounds are normal. There is no distension.     Palpations: Abdomen is soft.     Tenderness: There is no abdominal tenderness.  Musculoskeletal:        General: Tenderness (chest with palpation) present. Normal range of motion.     Cervical back: Normal range of motion and neck supple.  Skin:    General: Skin is  warm and dry.     Findings: No erythema or rash.  Neurological:     Mental Status: He is alert and oriented to person, place, and time.     Cranial Nerves: No cranial nerve deficit.     Deep Tendon Reflexes: Reflexes are normal and symmetric.  Psychiatric:        Behavior: Behavior normal.        Thought Content: Thought content normal.        Judgment: Judgment normal.      BP 130/79   Pulse 68   Temp (!) 96.9 F (36.1 C) (Temporal)   Ht 5\' 10"  (1.778 m)   Wt (!) 301 lb 9.6 oz (136.8 kg)   BMI 43.28 kg/m       Assessment & Plan:  [name] comes in today with chief complaint of Panic Attack and chest wall pain (wants some type of medication)   Diagnosis and orders addressed:  1. Hospital discharge follow-up  2. Chest wall pain Continue Motrin as needed Will give baclofen as needed - baclofen (LIORESAL) 10 MG tablet; Take 1 tablet (10 mg total)  by mouth 3 (three) times daily.  Dispense: 60 each; Refill: 3  3. GAD (generalized anxiety disorder) Continue Buspar 5 mg TID  - busPIRone (BUSPAR) 5 MG tablet; Take 1 tablet (5 mg total) by mouth 3 (three) times daily as needed.  Dispense: 90 tablet; Refill: 2  4. Panic attack - busPIRone (BUSPAR) 5 MG tablet; Take 1 tablet (5 mg total) by mouth 3 (three) times daily as needed.  Dispense: 90 tablet; Refill: 2  5. Morbid obesity (HCC)   Labs reviewed from ED Health Maintenance reviewed Diet and exercise encouraged  Follow up plan As needed    Jannifer Rodney, FNP

## 2019-10-15 ENCOUNTER — Ambulatory Visit: Payer: Medicaid Other | Admitting: Family

## 2019-11-19 ENCOUNTER — Telehealth: Payer: Self-pay | Admitting: Family

## 2019-11-19 NOTE — Telephone Encounter (Signed)
Went over lab results from July with wife, who is on hippa

## 2019-12-05 ENCOUNTER — Ambulatory Visit: Payer: Medicaid Other | Admitting: Nurse Practitioner

## 2020-05-25 ENCOUNTER — Other Ambulatory Visit: Payer: Self-pay | Admitting: Family

## 2020-05-25 DIAGNOSIS — I10 Essential (primary) hypertension: Secondary | ICD-10-CM

## 2020-08-09 ENCOUNTER — Other Ambulatory Visit: Payer: Self-pay | Admitting: Family

## 2020-08-09 DIAGNOSIS — F410 Panic disorder [episodic paroxysmal anxiety] without agoraphobia: Secondary | ICD-10-CM

## 2020-08-09 DIAGNOSIS — F411 Generalized anxiety disorder: Secondary | ICD-10-CM

## 2020-09-23 ENCOUNTER — Other Ambulatory Visit: Payer: Self-pay | Admitting: Family

## 2020-09-23 DIAGNOSIS — F410 Panic disorder [episodic paroxysmal anxiety] without agoraphobia: Secondary | ICD-10-CM

## 2020-09-23 DIAGNOSIS — F411 Generalized anxiety disorder: Secondary | ICD-10-CM

## 2020-09-23 NOTE — Telephone Encounter (Signed)
Patient aware that he must be seen for any refills.

## 2020-09-23 NOTE — Telephone Encounter (Signed)
Hawks. NTBS 30 days given 08/09/20

## 2020-11-05 ENCOUNTER — Encounter: Payer: Self-pay | Admitting: Family

## 2020-11-05 ENCOUNTER — Other Ambulatory Visit: Payer: Self-pay

## 2020-11-05 ENCOUNTER — Ambulatory Visit (INDEPENDENT_AMBULATORY_CARE_PROVIDER_SITE_OTHER): Payer: Self-pay | Admitting: Family

## 2020-11-05 VITALS — BP 138/80 | HR 70 | Temp 96.5°F | Resp 20 | Ht 70.0 in | Wt 324.0 lb

## 2020-11-05 DIAGNOSIS — G8929 Other chronic pain: Secondary | ICD-10-CM

## 2020-11-05 DIAGNOSIS — R0789 Other chest pain: Secondary | ICD-10-CM

## 2020-11-05 DIAGNOSIS — F410 Panic disorder [episodic paroxysmal anxiety] without agoraphobia: Secondary | ICD-10-CM

## 2020-11-05 DIAGNOSIS — M545 Low back pain, unspecified: Secondary | ICD-10-CM

## 2020-11-05 DIAGNOSIS — I10 Essential (primary) hypertension: Secondary | ICD-10-CM

## 2020-11-05 DIAGNOSIS — F411 Generalized anxiety disorder: Secondary | ICD-10-CM

## 2020-11-05 DIAGNOSIS — K2100 Gastro-esophageal reflux disease with esophagitis, without bleeding: Secondary | ICD-10-CM

## 2020-11-05 MED ORDER — BACLOFEN 10 MG PO TABS: 10.0000 mg | ORAL_TABLET | Freq: Three times a day (TID) | ORAL | 3 refills | Status: DC

## 2020-11-05 MED ORDER — LOSARTAN POTASSIUM 50 MG PO TABS: 50.0000 mg | ORAL_TABLET | Freq: Every day | ORAL | 3 refills | Status: DC

## 2020-11-05 MED ORDER — BUSPIRONE HCL 5 MG PO TABS: 5.0000 mg | ORAL_TABLET | Freq: Three times a day (TID) | ORAL | 3 refills | Status: DC | PRN

## 2020-11-05 MED ORDER — FAMOTIDINE 20 MG PO TABS: 20.0000 mg | ORAL_TABLET | Freq: Two times a day (BID) | ORAL | 3 refills | Status: DC

## 2020-11-05 MED ORDER — METOPROLOL TARTRATE 25 MG PO TABS: 25.0000 mg | ORAL_TABLET | Freq: Two times a day (BID) | ORAL | 2 refills | Status: DC

## 2020-11-05 MED ORDER — HYDROCHLOROTHIAZIDE 25 MG PO TABS: 25.0000 mg | ORAL_TABLET | Freq: Every day | ORAL | 0 refills | Status: DC

## 2020-11-05 NOTE — Progress Notes (Signed)
Subjective:    Patient ID: Jerry Warren, male    DOB: Aug 23, 1980, 40 y.o.   MRN: [id number]  Chief Complaint  Patient presents with   Medical Management of Chronic Issues   Pt presents to the office today for chronic follow up.  Hypertension This is a chronic problem. The current episode started more than 1 year ago. The problem has been resolved since onset. The problem is controlled. Associated symptoms include anxiety and palpitations. Pertinent negatives include no malaise/fatigue, peripheral edema or shortness of breath. Risk factors for coronary artery disease include obesity and male gender. The current treatment provides moderate improvement.  Gastroesophageal Reflux He complains of belching and heartburn. This is a chronic problem. The current episode started more than 1 year ago. The problem occurs occasionally. The problem has been waxing and waning. He has tried a PPI for the symptoms. The treatment provided moderate relief.  Anxiety Presents for follow-up visit. Symptoms include depressed mood, excessive worry, nervous/anxious behavior, palpitations and restlessness. Patient reports no shortness of breath. Symptoms occur occasionally. The severity of symptoms is moderate.       Review of Systems  Constitutional:  Negative for malaise/fatigue.  Respiratory:  Negative for shortness of breath.   Cardiovascular:  Positive for palpitations.  Gastrointestinal:  Positive for heartburn.  Psychiatric/Behavioral:  The patient is nervous/anxious.   All other systems reviewed and are negative.     Objective:   Physical Exam Vitals reviewed.  Constitutional:      General: He is not in acute distress.    Appearance: He is well-developed. He is obese.  HENT:     Head: Normocephalic.     Right Ear: Tympanic membrane normal.     Left Ear: Tympanic membrane normal.  Eyes:     General:        Right eye: No discharge.        Left eye: No discharge.     Pupils: Pupils are  equal, round, and reactive to light.  Neck:     Thyroid: No thyromegaly.  Cardiovascular:     Rate and Rhythm: Normal rate and regular rhythm.     Heart sounds: Normal heart sounds. No murmur heard. Pulmonary:     Effort: Pulmonary effort is normal. No respiratory distress.     Breath sounds: Normal breath sounds. No wheezing.  Abdominal:     General: Bowel sounds are normal. There is no distension.     Palpations: Abdomen is soft.     Tenderness: There is no abdominal tenderness.  Musculoskeletal:        General: No tenderness. Normal range of motion.     Cervical back: Normal range of motion and neck supple.  Skin:    General: Skin is warm and dry.     Findings: No erythema or rash.  Neurological:     Mental Status: He is alert and oriented to person, place, and time.     Cranial Nerves: No cranial nerve deficit.     Deep Tendon Reflexes: Reflexes are normal and symmetric.  Psychiatric:        Behavior: Behavior normal.        Thought Content: Thought content normal.        Judgment: Judgment normal.     BP 138/80   Pulse 70   Temp (!) 96.5 F (35.8 C) (Temporal)   Resp 20   Ht $R'5\' 10"'Tc$  (1.778 m)   Wt (!) 324 lb (147 kg)   SpO2  98%   BMI 46.49 kg/m      Assessment & Plan:  DEMIR TITSWORTH comes in today with chief complaint of Medical Management of Chronic Issues   Diagnosis and orders addressed:  1. Primary hypertension - CMP14+EGFR  2. Gastroesophageal reflux disease with esophagitis, unspecified whether hemorrhage - CMP14+EGFR  3. Morbid obesity (HCC)  - CMP14+EGFR  4. GAD (generalized anxiety disorder) - busPIRone (BUSPAR) 5 MG tablet; Take 1 tablet (5 mg total) by mouth 3 (three) times daily as needed. (NEEDS TO BE SEEN BEFORE NEXT REFILL)  Dispense: 90 tablet; Refill: 3 - CMP14+EGFR  5. Essential hypertension - metoprolol tartrate (LOPRESSOR) 25 MG tablet; Take 1 tablet (25 mg total) by mouth 2 (two) times daily.  Dispense: 180 tablet; Refill: 2 -  losartan (COZAAR) 50 MG tablet; Take 1 tablet (50 mg total) by mouth daily.  Dispense: 90 tablet; Refill: 3 - hydrochlorothiazide (HYDRODIURIL) 25 MG tablet; Take 1 tablet (25 mg total) by mouth daily. (NEEDS TO BE SEEN BEFORE NEXT REFILL)  Dispense: 30 tablet; Refill: 0 - CMP14+EGFR  6. Panic attack - busPIRone (BUSPAR) 5 MG tablet; Take 1 tablet (5 mg total) by mouth 3 (three) times daily as needed. (NEEDS TO BE SEEN BEFORE NEXT REFILL)  Dispense: 90 tablet; Refill: 3 - CMP14+EGFR  7. Chest wall pain - CMP14+EGFR  8. Chronic bilateral low back pain without sciatica - baclofen (LIORESAL) 10 MG tablet; Take 1 tablet (10 mg total) by mouth 3 (three) times daily.  Dispense: 60 each; Refill: 3 - CMP14+EGFR   Labs pending Health Maintenance reviewed Diet and exercise encouraged  Follow up plan: 1 year    Evelina Dun, FNP

## 2020-11-05 NOTE — Patient Instructions (Signed)
Health Maintenance, Male Adopting a healthy lifestyle and getting preventive care are important in promoting health and wellness. Ask your health care provider about: The right schedule for you to have regular tests and exams. Things you can do on your own to prevent diseases and keep yourself healthy. What should I know about diet, weight, and exercise? Eat a healthy diet  Eat a diet that includes plenty of vegetables, fruits, low-fat dairy products, and lean protein. Do not eat a lot of foods that are high in solid fats, added sugars, or sodium. Maintain a healthy weight Body mass index (BMI) is a measurement that can be used to identify possible weight problems. It estimates body fat based on height and weight. Your health care provider can help determine your BMI and help you achieve or maintain a healthy weight. Get regular exercise Get regular exercise. This is one of the most important things you can do for your health. Most adults should: Exercise for at least 150 minutes each week. The exercise should increase your heart rate and make you sweat (moderate-intensity exercise). Do strengthening exercises at least twice a week. This is in addition to the moderate-intensity exercise. Spend less time sitting. Even light physical activity can be beneficial. Watch cholesterol and blood lipids Have your blood tested for lipids and cholesterol at 42 years of age, then have this test every 5 years. You may need to have your cholesterol levels checked more often if: Your lipid or cholesterol levels are high. You are older than 42 years of age. You are at high risk for heart disease. What should I know about cancer screening? Many types of cancers can be detected early and may often be prevented. Depending on your health history and family history, you may need to have cancer screening at various ages. This may include screening for: Colorectal cancer. Prostate cancer. Skin cancer. Lung  cancer. What should I know about heart disease, diabetes, and high blood pressure? Blood pressure and heart disease High blood pressure causes heart disease and increases the risk of stroke. This is more likely to develop in people who have high blood pressure readings, are of African descent, or are overweight. Talk with your health care provider about your target blood pressure readings. Have your blood pressure checked: Every 3-5 years if you are 18-39 years of age. Every year if you are 40 years old or older. If you are between the ages of 65 and 75 and are a current or former smoker, ask your health care provider if you should have a one-time screening for abdominal aortic aneurysm (AAA). Diabetes Have regular diabetes screenings. This checks your fasting blood sugar level. Have the screening done: Once every three years after age 45 if you are at a normal weight and have a low risk for diabetes. More often and at a younger age if you are overweight or have a high risk for diabetes. What should I know about preventing infection? Hepatitis B If you have a higher risk for hepatitis B, you should be screened for this virus. Talk with your health care provider to find out if you are at risk for hepatitis B infection. Hepatitis C Blood testing is recommended for: Everyone born from 1945 through 1965. Anyone with known risk factors for hepatitis C. Sexually transmitted infections (STIs) You should be screened each year for STIs, including gonorrhea and chlamydia, if: You are sexually active and are younger than 42 years of age. You are older than 42 years   of age and your health care provider tells you that you are at risk for this type of infection. Your sexual activity has changed since you were last screened, and you are at increased risk for chlamydia or gonorrhea. Ask your health care provider if you are at risk. Ask your health care provider about whether you are at high risk for HIV.  Your health care provider may recommend a prescription medicine to help prevent HIV infection. If you choose to take medicine to prevent HIV, you should first get tested for HIV. You should then be tested every 3 months for as long as you are taking the medicine. Follow these instructions at home: Lifestyle Do not use any products that contain nicotine or tobacco, such as cigarettes, e-cigarettes, and chewing tobacco. If you need help quitting, ask your health care provider. Do not use street drugs. Do not share needles. Ask your health care provider for help if you need support or information about quitting drugs. Alcohol use Do not drink alcohol if your health care provider tells you not to drink. If you drink alcohol: Limit how much you have to 0-2 drinks a day. Be aware of how much alcohol is in your drink. In the [place] one drink equals one 12 oz bottle of beer (355 mL), one 5 oz glass of wine (148 mL), or one 1 oz glass of hard liquor (44 mL). General instructions Schedule regular health, dental, and eye exams. Stay current with your vaccines. Tell your health care provider if: You often feel depressed. You have ever been abused or do not feel safe at home. Summary Adopting a healthy lifestyle and getting preventive care are important in promoting health and wellness. Follow your health care provider's instructions about healthy diet, exercising, and getting tested or screened for diseases. Follow your health care provider's instructions on monitoring your cholesterol and blood pressure. This information is not intended to replace advice given to you by your health care provider. Make sure you discuss any questions you have with your health care provider. Document Revised: 04/16/2020 Document Reviewed: 01/30/2018 Elsevier Patient Education  2022 [employer] Inc[employer]  

## 2020-11-06 LAB — CMP14+EGFR
ALT: 27 IU/L (ref 0–44)
AST: 26 IU/L (ref 0–40)
Albumin/Globulin Ratio: 1.6 (ref 1.2–2.2)
Albumin: 4.4 g/dL (ref 4.0–5.0)
Alkaline Phosphatase: 87 IU/L (ref 44–121)
BUN/Creatinine Ratio: 9 (ref 9–20)
BUN: 13 mg/dL (ref 6–24)
Bilirubin Total: 0.4 mg/dL (ref 0.0–1.2)
CO2: 27 mmol/L (ref 20–29)
Calcium: 8.7 mg/dL (ref 8.7–10.2)
Chloride: 96 mmol/L (ref 96–106)
Creatinine, Ser: 1.4 mg/dL — ABNORMAL HIGH (ref 0.76–1.27)
Globulin, Total: 2.8 g/dL (ref 1.5–4.5)
Glucose: 94 mg/dL (ref 65–99)
Potassium: 3.3 mmol/L — ABNORMAL LOW (ref 3.5–5.2)
Sodium: 141 mmol/L (ref 134–144)
Total Protein: 7.2 g/dL (ref 6.0–8.5)
eGFR: 64 mL/min/{1.73_m2} (ref >59)

## 2020-11-08 ENCOUNTER — Telehealth: Payer: Self-pay | Admitting: Family

## 2020-11-08 ENCOUNTER — Other Ambulatory Visit: Payer: Self-pay | Admitting: Family

## 2020-11-08 MED ORDER — POTASSIUM CHLORIDE CRYS ER 20 MEQ PO TBCR
20.0000 meq | EXTENDED_RELEASE_TABLET | Freq: Every day | ORAL | 0 refills | Status: DC
Start: 2020-11-08 — End: 2021-09-30

## 2020-11-08 NOTE — Telephone Encounter (Signed)
Patient aware.

## 2021-01-01 ENCOUNTER — Other Ambulatory Visit: Payer: Self-pay | Admitting: Family

## 2021-01-01 DIAGNOSIS — I10 Essential (primary) hypertension: Secondary | ICD-10-CM

## 2021-02-17 ENCOUNTER — Telehealth: Payer: Self-pay | Admitting: Family

## 2021-02-17 NOTE — Telephone Encounter (Signed)
Wife request that letter be printed. Letter printed and pt will come by and pick up this afternoon.

## 2021-02-17 NOTE — Telephone Encounter (Signed)
Hello, I have sent a note to your MyChart. It will be under letters.

## 2021-08-17 ENCOUNTER — Other Ambulatory Visit: Payer: Self-pay | Admitting: Family

## 2021-08-17 DIAGNOSIS — I10 Essential (primary) hypertension: Secondary | ICD-10-CM

## 2021-09-02 ENCOUNTER — Ambulatory Visit (INDEPENDENT_AMBULATORY_CARE_PROVIDER_SITE_OTHER): Payer: 59

## 2021-09-02 ENCOUNTER — Ambulatory Visit (INDEPENDENT_AMBULATORY_CARE_PROVIDER_SITE_OTHER): Payer: 59 | Admitting: Nurse Practitioner

## 2021-09-02 ENCOUNTER — Encounter: Payer: Self-pay | Admitting: Nurse Practitioner

## 2021-09-02 VITALS — BP 139/89 | HR 69 | Temp 98.7°F | Ht 70.0 in | Wt 326.6 lb

## 2021-09-02 DIAGNOSIS — R1084 Generalized abdominal pain: Secondary | ICD-10-CM

## 2021-09-02 DIAGNOSIS — R110 Nausea: Secondary | ICD-10-CM

## 2021-09-02 LAB — URINALYSIS, ROUTINE W REFLEX MICROSCOPIC
Bilirubin, UA: NEGATIVE
Glucose, UA: NEGATIVE
Ketones, UA: NEGATIVE
Leukocytes,UA: NEGATIVE
Nitrite, UA: NEGATIVE
Protein,UA: NEGATIVE
Specific Gravity, UA: 1.02 (ref 1.005–1.030)
Urobilinogen, Ur: 0.2 mg/dL (ref 0.2–1.0)
pH, UA: 5.5 (ref 5.0–7.5)

## 2021-09-02 LAB — MICROSCOPIC EXAMINATION
Bacteria, UA: NONE SEEN
Epithelial Cells (non renal): NONE SEEN /hpf (ref 0–10)
RBC, Urine: NONE SEEN /hpf (ref 0–2)
Renal Epithel, UA: NONE SEEN /hpf
WBC, UA: NONE SEEN /hpf (ref 0–5)

## 2021-09-02 MED ORDER — OMEPRAZOLE 20 MG PO CPDR: 20.0000 mg | DELAYED_RELEASE_CAPSULE | Freq: Two times a day (BID) | ORAL | 3 refills | Status: DC

## 2021-09-02 MED ORDER — ONDANSETRON HCL 4 MG PO TABS: 4.0000 mg | ORAL_TABLET | Freq: Three times a day (TID) | ORAL | 0 refills | Status: DC | PRN

## 2021-09-02 NOTE — Patient Instructions (Signed)
Abdominal Pain, Adult Many things can cause belly (abdominal) pain. Most times, belly pain is not dangerous. Many cases of belly pain can be watched and treated at home. Sometimes, though, belly pain is serious. Your doctor will try to find the cause of your belly pain. Follow these instructions at home:  Medicines Take over-the-counter and prescription medicines only as told by your doctor. Do not take medicines that help you poop (laxatives) unless told by your doctor. General instructions Watch your belly pain for any changes. Drink enough fluid to keep your pee (urine) pale yellow. Keep all follow-up visits as told by your doctor. This is important. Contact a doctor if: Your belly pain changes or gets worse. You are not hungry, or you lose weight without trying. You are having trouble pooping (constipated) or have watery poop (diarrhea) for more than 2-3 days. You have pain when you pee or poop. Your belly pain wakes you up at night. Your pain gets worse with meals, after eating, or with certain foods. You are vomiting and cannot keep anything down. You have a fever. You have blood in your pee. Get help right away if: Your pain does not go away as soon as your doctor says it should. You cannot stop vomiting. Your pain is only in areas of your belly, such as the right side or the left lower part of the belly. You have bloody or black poop, or poop that looks like tar. You have very bad pain, cramping, or bloating in your belly. You have signs of not having enough fluid or water in your body (dehydration), such as: Dark pee, very little pee, or no pee. Cracked lips. Dry mouth. Sunken eyes. Sleepiness. Weakness. You have trouble breathing or chest pain. Summary Many cases of belly pain can be watched and treated at home. Watch your belly pain for any changes. Take over-the-counter and prescription medicines only as told by your doctor. Contact a doctor if your belly pain  changes or gets worse. Get help right away if you have very bad pain, cramping, or bloating in your belly. This information is not intended to replace advice given to you by your health care provider. Make sure you discuss any questions you have with your health care provider. Document Revised: 06/17/2018 Document Reviewed: 06/17/2018 Elsevier Patient Education  2023 Elsevier Inc.  

## 2021-09-02 NOTE — Progress Notes (Signed)
Acute Office Visit  Subjective:     Patient ID: Jerry Warren, male    DOB: 1981/01/23, 41 y.o.   MRN: [id number]  Chief Complaint  Patient presents with   Abdominal Pain    Abdominal Pain This is a new problem. The current episode started yesterday. The onset quality is gradual. The problem occurs constantly. The problem has been unchanged. The pain is located in the generalized abdominal region, RUQ and LUQ. The pain is moderate. The quality of the pain is aching and a sensation of fullness. The abdominal pain radiates to the back. Associated symptoms include nausea. Pertinent negatives include no diarrhea, dysuria or frequency.     Review of Systems  Constitutional: Negative.   HENT: Negative.    Respiratory: Negative.    Cardiovascular: Negative.   Gastrointestinal:  Positive for abdominal pain and nausea. Negative for diarrhea.  Genitourinary: Negative.  Negative for dysuria, frequency and urgency.  Skin: Negative.  Negative for rash.  Psychiatric/Behavioral: Negative.    All other systems reviewed and are negative.       Objective:    BP 139/89   Pulse 69   Temp 98.7 F (37.1 C)   Ht 5\' 10"  (1.778 m)   Wt (!) 326 lb 9.6 oz (148.1 kg)   SpO2 96%   BMI 46.86 kg/m  BP Readings from Last 3 Encounters:  09/02/21 139/89  11/05/20 138/80  10/09/19 130/79   Wt Readings from Last 3 Encounters:  09/02/21 (!) 326 lb 9.6 oz (148.1 kg)  11/05/20 (!) 324 lb (147 kg)  10/09/19 (!) 301 lb 9.6 oz (136.8 kg)      Physical Exam Vitals reviewed.  Constitutional:      Appearance: He is well-developed. He is obese.  HENT:     Head: Normocephalic.     Right Ear: External ear normal.     Left Ear: External ear normal.     Nose: Nose normal.     Mouth/Throat:     Mouth: Mucous membranes are moist.  Eyes:     Conjunctiva/sclera: Conjunctivae normal.  Cardiovascular:     Rate and Rhythm: Normal rate and regular rhythm.     Pulses: Normal pulses.     Heart sounds:  Normal heart sounds.  Pulmonary:     Effort: Pulmonary effort is normal.     Breath sounds: Normal breath sounds.  Abdominal:     General: Bowel sounds are normal.     Palpations: Abdomen is soft.     Tenderness: There is generalized abdominal tenderness and tenderness in the right upper quadrant and left upper quadrant. There is no right CVA tenderness or left CVA tenderness. Negative signs include Murphy's sign, Rovsing's sign, McBurney's sign, psoas sign and obturator sign.  Skin:    General: Skin is warm.     Findings: No rash.  Neurological:     General: No focal deficit present.     Mental Status: He is alert and oriented to person, place, and time.     No results found for any visits on 09/02/21.      Assessment & Plan:  Patient presents with generalized abdominal pain, feelings of bloatedness, burping and back pain.  Patient reports that pain was worse yesterday right upper and left upper quadrant.  He has done nothing for symptom management.  Advised patient to take Tylenol for pain, urinalysis completed results pending.  Abdominal x-ray completed results pending Zofran 4 mg tablet by mouth for nausea.  Patient  knows to follow-up with worsening unresolved symptoms. Problem List Items Addressed This Visit   None Visit Diagnoses     Generalized abdominal pain    -  Primary   Relevant Medications   omeprazole (PRILOSEC) 20 MG capsule   Other Relevant Orders   DG Abd 1 View   Urinalysis, Routine w reflex microscopic   CULTURE, URINE COMPREHENSIVE   Nausea       Relevant Medications   ondansetron (ZOFRAN) 4 MG tablet       Meds ordered this encounter  Medications   ondansetron (ZOFRAN) 4 MG tablet    Sig: Take 1 tablet (4 mg total) by mouth every 8 (eight) hours as needed for nausea or vomiting.    Dispense:  20 tablet    Refill:  0    Order Specific Question:   Supervising Provider    Answer:   Standley Brooking   omeprazole (PRILOSEC) 20 MG capsule     Sig: Take 1 capsule (20 mg total) by mouth 2 (two) times daily before a meal.    Dispense:  30 capsule    Refill:  3    Order Specific Question:   Supervising Provider    Answer:   Mechele Claude [phone]    Return if symptoms worsen or fail to improve.  Daryll Drown, NP

## 2021-09-09 LAB — CULTURE, URINE COMPREHENSIVE

## 2021-09-19 ENCOUNTER — Other Ambulatory Visit: Payer: Self-pay | Admitting: Family

## 2021-09-19 ENCOUNTER — Encounter: Payer: Self-pay | Admitting: Family

## 2021-09-19 DIAGNOSIS — I10 Essential (primary) hypertension: Secondary | ICD-10-CM

## 2021-09-19 NOTE — Telephone Encounter (Signed)
[facility] TO SCHEDULE APPT LETTER MAILED 

## 2021-09-19 NOTE — Telephone Encounter (Signed)
[facility] NTBS 30 days given 08/17/21

## 2021-09-20 ENCOUNTER — Other Ambulatory Visit: Payer: Self-pay | Admitting: Family

## 2021-09-20 DIAGNOSIS — I10 Essential (primary) hypertension: Secondary | ICD-10-CM

## 2021-09-30 ENCOUNTER — Encounter: Payer: Self-pay | Admitting: Family

## 2021-09-30 ENCOUNTER — Ambulatory Visit (INDEPENDENT_AMBULATORY_CARE_PROVIDER_SITE_OTHER): Payer: 59 | Admitting: Family

## 2021-09-30 VITALS — BP 135/88 | HR 68 | Temp 96.4°F | Ht 70.0 in | Wt 322.4 lb

## 2021-09-30 DIAGNOSIS — Z7289 Other problems related to lifestyle: Secondary | ICD-10-CM | POA: Insufficient documentation

## 2021-09-30 DIAGNOSIS — Z114 Encounter for screening for human immunodeficiency virus [HIV]: Secondary | ICD-10-CM

## 2021-09-30 DIAGNOSIS — F411 Generalized anxiety disorder: Secondary | ICD-10-CM | POA: Diagnosis not present

## 2021-09-30 DIAGNOSIS — F410 Panic disorder [episodic paroxysmal anxiety] without agoraphobia: Secondary | ICD-10-CM | POA: Diagnosis not present

## 2021-09-30 DIAGNOSIS — Z1159 Encounter for screening for other viral diseases: Secondary | ICD-10-CM

## 2021-09-30 DIAGNOSIS — I10 Essential (primary) hypertension: Secondary | ICD-10-CM

## 2021-09-30 DIAGNOSIS — Z0001 Encounter for general adult medical examination with abnormal findings: Secondary | ICD-10-CM | POA: Diagnosis not present

## 2021-09-30 DIAGNOSIS — K2100 Gastro-esophageal reflux disease with esophagitis, without bleeding: Secondary | ICD-10-CM

## 2021-09-30 DIAGNOSIS — M545 Low back pain, unspecified: Secondary | ICD-10-CM | POA: Insufficient documentation

## 2021-09-30 DIAGNOSIS — Z0000 Encounter for general adult medical examination without abnormal findings: Secondary | ICD-10-CM

## 2021-09-30 DIAGNOSIS — G8929 Other chronic pain: Secondary | ICD-10-CM

## 2021-09-30 MED ORDER — METOPROLOL TARTRATE 25 MG PO TABS: 25.0000 mg | ORAL_TABLET | Freq: Two times a day (BID) | ORAL | 3 refills | Status: DC

## 2021-09-30 MED ORDER — BACLOFEN 10 MG PO TABS: 10.0000 mg | ORAL_TABLET | Freq: Three times a day (TID) | ORAL | 3 refills | Status: DC

## 2021-09-30 MED ORDER — LOSARTAN POTASSIUM 50 MG PO TABS
50.0000 mg | ORAL_TABLET | Freq: Every day | ORAL | 3 refills | Status: DC
Start: 2021-09-30 — End: 2022-09-08

## 2021-09-30 MED ORDER — HYDROCHLOROTHIAZIDE 25 MG PO TABS: 25.0000 mg | ORAL_TABLET | Freq: Every day | ORAL | 3 refills | Status: DC

## 2021-09-30 MED ORDER — BUSPIRONE HCL 5 MG PO TABS: 5.0000 mg | ORAL_TABLET | Freq: Three times a day (TID) | ORAL | 6 refills | Status: DC | PRN

## 2021-09-30 NOTE — Progress Notes (Signed)
Subjective:    Patient ID: Jerry Warren, male    DOB: 08/07/1980, 41 y.o.   MRN: [id number]  Pt presents to the office today for CPE. Reports he does vape.  Hypertension This is a chronic problem. The current episode started more than 1 year ago. The problem has been resolved since onset. The problem is controlled. Pertinent negatives include no malaise/fatigue, peripheral edema or shortness of breath. Risk factors for coronary artery disease include dyslipidemia, obesity, male gender, sedentary lifestyle and smoking/tobacco exposure. The current treatment provides moderate improvement.  Gastroesophageal Reflux He complains of belching and heartburn. This is a chronic problem. The current episode started more than 1 year ago. The problem occurs occasionally. Risk factors include obesity and smoking/tobacco exposure. He has tried a PPI for the symptoms. The treatment provided moderate relief.  Back Pain This is a chronic problem. The current episode started more than 1 year ago. The problem has been waxing and waning since onset. The pain is present in the lumbar spine. The quality of the pain is described as aching. The pain is at a severity of 2/10. The pain is mild.      Review of Systems  Constitutional:  Negative for malaise/fatigue.  Respiratory:  Negative for shortness of breath.   Gastrointestinal:  Positive for heartburn.  Musculoskeletal:  Positive for back pain.  All other systems reviewed and are negative.  Family History  Problem Relation Age of Onset   Diabetes Father    Hypertension Father    Social History   Socioeconomic History   Marital status: Married    Spouse name: Not on file   Number of children: Not on file   Years of education: Not on file   Highest education level: Not on file  Occupational History   Not on file  Tobacco Use   Smoking status: Former    Packs/day: 1.00    Years: 19.00    Total pack years: 19.00    Types: Cigarettes    Start  date: 11/04/1994    Quit date: 08/24/2013    Years since quitting: 8.1   Smokeless tobacco: Never  Vaping Use   Vaping Use: Never used  Substance and Sexual Activity   Alcohol use: No    Alcohol/week: 0.0 standard drinks of alcohol   Drug use: No   Sexual activity: Not on file  Other Topics Concern   Not on file  Social History Narrative   Not on file   Social Determinants of Health   Financial Resource Strain: Not on file  Food Insecurity: Not on file  Transportation Needs: Not on file  Physical Activity: Not on file  Stress: Not on file  Social Connections: Not on file       Objective:   Physical Exam Vitals reviewed.  Constitutional:      General: He is not in acute distress.    Appearance: He is well-developed. He is obese. He is not ill-appearing.  HENT:     Head: Normocephalic.     Right Ear: Tympanic membrane normal.     Left Ear: Tympanic membrane normal.  Eyes:     General:        Right eye: No discharge.        Left eye: No discharge.     Pupils: Pupils are equal, round, and reactive to light.  Neck:     Thyroid: No thyromegaly.  Cardiovascular:     Rate and Rhythm: Normal rate and regular  rhythm.     Heart sounds: Normal heart sounds. No murmur heard. Pulmonary:     Effort: Pulmonary effort is normal. No respiratory distress.     Breath sounds: Normal breath sounds. No wheezing.  Abdominal:     General: Bowel sounds are normal. There is no distension.     Palpations: Abdomen is soft.     Tenderness: There is no abdominal tenderness.  Musculoskeletal:        General: No tenderness. Normal range of motion.     Cervical back: Normal range of motion and neck supple.  Skin:    General: Skin is warm and dry.     Findings: No erythema or rash.  Neurological:     Mental Status: He is alert and oriented to person, place, and time.     Cranial Nerves: No cranial nerve deficit.     Deep Tendon Reflexes: Reflexes are normal and symmetric.  Psychiatric:         Behavior: Behavior normal.        Thought Content: Thought content normal.        Judgment: Judgment normal.          BP 135/88 Comment: patient reports at home  Pulse 68   Temp (!) 96.4 F (35.8 C) (Temporal)   Ht _0  (1.778 m)   Wt (!) 322 lb 6.4 oz (146.2 kg)   SpO2 95%   BMI 46.26 kg/m   Assessment & Plan:  Jerry Warren comes in today with chief complaint of Medical Management of Chronic Issues   Diagnosis and orders addressed:  1. Chronic bilateral low back pain without sciatica - baclofen (LIORESAL) 10 MG tablet; Take 1 tablet (10 mg total) by mouth 3 (three) times daily.  Dispense: 60 each; Refill: 3 - CMP14+EGFR - CBC with Differential/Platelet  2. GAD (generalized anxiety disorder) - busPIRone (BUSPAR) 5 MG tablet; Take 1 tablet (5 mg total) by mouth 3 (three) times daily as needed.  Dispense: 90 tablet; Refill: 6 - CMP14+EGFR - CBC with Differential/Platelet  3. Panic attack  - busPIRone (BUSPAR) 5 MG tablet; Take 1 tablet (5 mg total) by mouth 3 (three) times daily as needed.  Dispense: 90 tablet; Refill: 6 - CMP14+EGFR - CBC with Differential/Platelet  4. Annual physical exam - CMP14+EGFR - CBC with Differential/Platelet - Lipid panel - PSA, total and free - TSH  5. Need for hepatitis C screening test - Hepatitis C antibody  6. Encounter for screening for HIV  - HIV Antibody (routine testing w rflx)  7. Primary hypertension - metoprolol tartrate (LOPRESSOR) 25 MG tablet; Take 1 tablet (25 mg total) by mouth 2 (two) times daily.  Dispense: 180 tablet; Refill: 3 - hydrochlorothiazide (HYDRODIURIL) 25 MG tablet; Take 1 tablet (25 mg total) by mouth daily.  Dispense: 90 tablet; Refill: 3 - losartan (COZAAR) 50 MG tablet; Take 1 tablet (50 mg total) by mouth daily.  Dispense: 90 tablet; Refill: 3  8. Gastroesophageal reflux disease with esophagitis, unspecified whether hemorrhage  9. Morbid obesity ([place])  10. Current every day  vaping    Labs pending Health Maintenance reviewed Diet and exercise encouraged  Follow up plan: 6 months   Evelina Dun, FNP

## 2021-09-30 NOTE — Patient Instructions (Signed)
Health Maintenance, Male Adopting a healthy lifestyle and getting preventive care are important in promoting health and wellness. Ask your health care provider about: The right schedule for you to have regular tests and exams. Things you can do on your own to prevent diseases and keep yourself healthy. What should I know about diet, weight, and exercise? Eat a healthy diet  Eat a diet that includes plenty of vegetables, fruits, low-fat dairy products, and lean protein. Do not eat a lot of foods that are high in solid fats, added sugars, or sodium. Maintain a healthy weight Body mass index (BMI) is a measurement that can be used to identify possible weight problems. It estimates body fat based on height and weight. Your health care provider can help determine your BMI and help you achieve or maintain a healthy weight. Get regular exercise Get regular exercise. This is one of the most important things you can do for your health. Most adults should: Exercise for at least 150 minutes each week. The exercise should increase your heart rate and make you sweat (moderate-intensity exercise). Do strengthening exercises at least twice a week. This is in addition to the moderate-intensity exercise. Spend less time sitting. Even light physical activity can be beneficial. Watch cholesterol and blood lipids Have your blood tested for lipids and cholesterol at 43 years of age, then have this test every 5 years. You may need to have your cholesterol levels checked more often if: Your lipid or cholesterol levels are high. You are older than 43 years of age. You are at high risk for heart disease. What should I know about cancer screening? Many types of cancers can be detected early and may often be prevented. Depending on your health history and family history, you may need to have cancer screening at various ages. This may include screening for: Colorectal cancer. Prostate cancer. Skin cancer. Lung  cancer. What should I know about heart disease, diabetes, and high blood pressure? Blood pressure and heart disease High blood pressure causes heart disease and increases the risk of stroke. This is more likely to develop in people who have high blood pressure readings or are overweight. Talk with your health care provider about your target blood pressure readings. Have your blood pressure checked: Every 3-5 years if you are 18-39 years of age. Every year if you are 40 years old or older. If you are between the ages of 65 and 75 and are a current or former smoker, ask your health care provider if you should have a one-time screening for abdominal aortic aneurysm (AAA). Diabetes Have regular diabetes screenings. This checks your fasting blood sugar level. Have the screening done: Once every three years after age 45 if you are at a normal weight and have a low risk for diabetes. More often and at a younger age if you are overweight or have a high risk for diabetes. What should I know about preventing infection? Hepatitis B If you have a higher risk for hepatitis B, you should be screened for this virus. Talk with your health care provider to find out if you are at risk for hepatitis B infection. Hepatitis C Blood testing is recommended for: Everyone born from 1945 through 1965. Anyone with known risk factors for hepatitis C. Sexually transmitted infections (STIs) You should be screened each year for STIs, including gonorrhea and chlamydia, if: You are sexually active and are younger than 43 years of age. You are older than 43 years of age and your   health care provider tells you that you are at risk for this type of infection. Your sexual activity has changed since you were last screened, and you are at increased risk for chlamydia or gonorrhea. Ask your health care provider if you are at risk. Ask your health care provider about whether you are at high risk for HIV. Your health care provider  may recommend a prescription medicine to help prevent HIV infection. If you choose to take medicine to prevent HIV, you should first get tested for HIV. You should then be tested every 3 months for as long as you are taking the medicine. Follow these instructions at home: Alcohol use Do not drink alcohol if your health care provider tells you not to drink. If you drink alcohol: Limit how much you have to 0-2 drinks a day. Know how much alcohol is in your drink. In the [place] one drink equals one 12 oz bottle of beer (355 mL), one 5 oz glass of wine (148 mL), or one 1 oz glass of hard liquor (44 mL). Lifestyle Do not use any products that contain nicotine or tobacco. These products include cigarettes, chewing tobacco, and vaping devices, such as e-cigarettes. If you need help quitting, ask your health care provider. Do not use street drugs. Do not share needles. Ask your health care provider for help if you need support or information about quitting drugs. General instructions Schedule regular health, dental, and eye exams. Stay current with your vaccines. Tell your health care provider if: You often feel depressed. You have ever been abused or do not feel safe at home. Summary Adopting a healthy lifestyle and getting preventive care are important in promoting health and wellness. Follow your health care provider's instructions about healthy diet, exercising, and getting tested or screened for diseases. Follow your health care provider's instructions on monitoring your cholesterol and blood pressure. This information is not intended to replace advice given to you by your health care provider. Make sure you discuss any questions you have with your health care provider. Document Revised: 06/28/2020 Document Reviewed: 06/28/2020 Elsevier Patient Education  2023 [employer] Inc[employer]  

## 2021-10-01 LAB — CMP14+EGFR
ALT: 30 IU/L (ref 0–44)
AST: 26 IU/L (ref 0–40)
Albumin/Globulin Ratio: 1.8 (ref 1.2–2.2)
Albumin: 4.6 g/dL (ref 4.1–5.1)
Alkaline Phosphatase: 89 IU/L (ref 44–121)
BUN/Creatinine Ratio: 10 (ref 9–20)
BUN: 13 mg/dL (ref 6–24)
Bilirubin Total: 0.4 mg/dL (ref 0.0–1.2)
CO2: 24 mmol/L (ref 20–29)
Calcium: 9 mg/dL (ref 8.7–10.2)
Chloride: 96 mmol/L (ref 96–106)
Creatinine, Ser: 1.34 mg/dL — ABNORMAL HIGH (ref 0.76–1.27)
Globulin, Total: 2.6 g/dL (ref 1.5–4.5)
Glucose: 106 mg/dL — ABNORMAL HIGH (ref 70–99)
Potassium: 3.7 mmol/L (ref 3.5–5.2)
Sodium: 139 mmol/L (ref 134–144)
Total Protein: 7.2 g/dL (ref 6.0–8.5)
eGFR: 68 mL/min/{1.73_m2} (ref >59)

## 2021-10-01 LAB — PSA, TOTAL AND FREE
PSA, Free Pct: 28.5 %
PSA, Free: 0.37 ng/mL
Prostate Specific Ag, Serum: 1.3 ng/mL (ref 0.0–4.0)

## 2021-10-01 LAB — CBC WITH DIFFERENTIAL/PLATELET
Basophils Absolute: 0 10*3/uL (ref 0.0–0.2)
Basos: 0 %
EOS (ABSOLUTE): 0.1 10*3/uL (ref 0.0–0.4)
Eos: 1 %
Hematocrit: 42.3 % (ref 37.5–51.0)
Hemoglobin: 14.7 g/dL (ref 13.0–17.7)
Immature Grans (Abs): 0 10*3/uL (ref 0.0–0.1)
Immature Granulocytes: 0 %
Lymphocytes Absolute: 3.7 10*3/uL — ABNORMAL HIGH (ref 0.7–3.1)
Lymphs: 41 %
MCH: 30.5 pg (ref 26.6–33.0)
MCHC: 34.8 g/dL (ref 31.5–35.7)
MCV: 88 fL (ref 79–97)
Monocytes Absolute: 0.6 10*3/uL (ref 0.1–0.9)
Monocytes: 7 %
Neutrophils Absolute: 4.5 10*3/uL (ref 1.4–7.0)
Neutrophils: 51 %
Platelets: 223 10*3/uL (ref 150–450)
RBC: 4.82 x10E6/uL (ref 4.14–5.80)
RDW: 14.5 % (ref 11.6–15.4)
WBC: 8.9 10*3/uL (ref 3.4–10.8)

## 2021-10-01 LAB — LIPID PANEL
Chol/HDL Ratio: 6.6 ratio — ABNORMAL HIGH (ref 0.0–5.0)
Cholesterol, Total: 205 mg/dL — ABNORMAL HIGH (ref 100–199)
HDL: 31 mg/dL — ABNORMAL LOW (ref >39)
LDL Chol Calc (NIH): 135 mg/dL — ABNORMAL HIGH (ref 0–99)
Triglycerides: 217 mg/dL — ABNORMAL HIGH (ref 0–149)
VLDL Cholesterol Cal: 39 mg/dL (ref 5–40)

## 2021-10-01 LAB — HIV ANTIBODY (ROUTINE TESTING W REFLEX): HIV Screen 4th Generation wRfx: NONREACTIVE

## 2021-10-01 LAB — HEPATITIS C ANTIBODY: Hep C Virus Ab: NONREACTIVE

## 2021-10-01 LAB — TSH: TSH: 1.77 u[IU]/mL (ref 0.450–4.500)

## 2021-12-21 ENCOUNTER — Telehealth: Payer: Self-pay | Admitting: Family

## 2021-12-21 NOTE — Telephone Encounter (Signed)
Patient's wife is concerned because her husband's blood pressure has been elevated the last few days.  She is unsure of exact numbers but knows the systolic number has been in the 170's.  I scheduled an appointment for him next week with [facility] for follow up.  Offered an appointment this week but could not get off work to come.  Wife wanted me to ask if anything was recommended until he can come in.  Patient now taking Losartan 50 mg QD, Metoprolol 25 mg BID.

## 2021-12-22 NOTE — Telephone Encounter (Signed)
If BP is >140/90 at home, he can take 100 mg of losartan. Keep follow up with me next week.

## 2021-12-22 NOTE — Telephone Encounter (Signed)
Patient aware and verbalized understanding. °

## 2021-12-27 ENCOUNTER — Encounter: Payer: Self-pay | Admitting: Family

## 2021-12-27 ENCOUNTER — Ambulatory Visit (INDEPENDENT_AMBULATORY_CARE_PROVIDER_SITE_OTHER): Payer: 59 | Admitting: Family

## 2021-12-27 VITALS — BP 133/80 | HR 62 | Temp 96.4°F | Ht 70.0 in | Wt 300.0 lb

## 2021-12-27 DIAGNOSIS — F411 Generalized anxiety disorder: Secondary | ICD-10-CM | POA: Diagnosis not present

## 2021-12-27 DIAGNOSIS — R69 Illness, unspecified: Secondary | ICD-10-CM | POA: Diagnosis not present

## 2021-12-27 DIAGNOSIS — I10 Essential (primary) hypertension: Secondary | ICD-10-CM

## 2021-12-27 MED ORDER — ESCITALOPRAM OXALATE 10 MG PO TABS: 10.0000 mg | ORAL_TABLET | Freq: Every day | ORAL | 3 refills | Status: DC

## 2021-12-27 NOTE — Patient Instructions (Signed)

## 2021-12-27 NOTE — Progress Notes (Signed)
Subjective:    Patient ID: Jerry Warren, male    DOB: 1980-12-27, 41 y.o.   MRN: [id number]  Chief Complaint  Patient presents with   Hypertension    Has HA. States he feels loopy but not dizzy    PT presents to the office today for elevated BP at home. He reports his BP at home 160's/89. Today his BP is at goal.  He reports his anxiety has been increased related to fiances. He has also been dieting from 11/20/21 with no cabs or sugars. He has lost 22 lbs since starting.      12/27/2021    3:48 PM 09/30/2021    3:23 PM 09/02/2021    3:30 PM  Last 3 Weights  Weight (lbs) 300 lb 322 lb 6.4 oz 326 lb 9.6 oz  Weight (kg) 136.079 kg 146.24 kg 148.145 kg     Hypertension This is a chronic problem. The current episode started more than 1 year ago. The problem has been waxing and waning since onset. The problem is uncontrolled. Associated symptoms include anxiety. Pertinent negatives include no malaise/fatigue, peripheral edema or shortness of breath. Risk factors for coronary artery disease include dyslipidemia, obesity, male gender and smoking/tobacco exposure. Past treatments include diuretics and angiotensin blockers. The current treatment provides mild improvement. There is no history of CVA or heart failure.  Anxiety Presents for follow-up visit. Symptoms include decreased concentration, depressed mood, excessive worry, irritability, nervous/anxious behavior and restlessness. Patient reports no shortness of breath. Symptoms occur most days. The severity of symptoms is moderate.        Review of Systems  Constitutional:  Positive for irritability. Negative for malaise/fatigue.  Respiratory:  Negative for shortness of breath.   Psychiatric/Behavioral:  Positive for decreased concentration. The patient is nervous/anxious.   All other systems reviewed and are negative.      Objective:   Physical Exam Vitals reviewed.  Constitutional:      General: He is not in acute  distress.    Appearance: He is well-developed. He is obese.  HENT:     Head: Normocephalic.     Right Ear: Tympanic membrane normal.     Left Ear: Tympanic membrane normal.  Eyes:     General:        Right eye: No discharge.        Left eye: No discharge.     Pupils: Pupils are equal, round, and reactive to light.  Neck:     Thyroid: No thyromegaly.  Cardiovascular:     Rate and Rhythm: Normal rate and regular rhythm.     Heart sounds: Normal heart sounds. No murmur heard. Pulmonary:     Effort: Pulmonary effort is normal. No respiratory distress.     Breath sounds: Normal breath sounds. No wheezing.  Abdominal:     General: Bowel sounds are normal. There is no distension.     Palpations: Abdomen is soft.     Tenderness: There is no abdominal tenderness.  Musculoskeletal:        General: No tenderness. Normal range of motion.     Cervical back: Normal range of motion and neck supple.  Skin:    General: Skin is warm and dry.     Findings: No erythema or rash.  Neurological:     Mental Status: He is alert and oriented to person, place, and time.     Cranial Nerves: No cranial nerve deficit.     Deep Tendon Reflexes: Reflexes are normal  and symmetric.  Psychiatric:        Behavior: Behavior normal.        Thought Content: Thought content normal.        Judgment: Judgment normal.       BP 133/80   Pulse 62   Temp (!) 96.4 F (35.8 C) (Temporal)   Ht 5\' 10"  (1.778 m)   Wt 300 lb (136.1 kg)   BMI 43.05 kg/m      Assessment & Plan:  Jerry Warren comes in today with chief complaint of Hypertension (Has HA. States he feels loopy but not dizzy )   Diagnosis and orders addressed:  1. Primary hypertension Continue medications  -Dash diet information given -Exercise encouraged - Stress Management  -Bring BP cuff in next appointment   2. Morbid obesity (HCC)  3. GAD (generalized anxiety disorder) Start Lexapro 10 mg  Stress management  - escitalopram  (LEXAPRO) 10 MG tablet; Take 1 tablet (10 mg total) by mouth daily.  Dispense: 90 tablet; Refill: 3   Labs pending Health Maintenance reviewed Diet and exercise encouraged  Follow up plan: 1 month to follow up with on GAD and HTN   Jyl Heinz, FNP

## 2022-01-23 ENCOUNTER — Encounter: Payer: Self-pay | Admitting: Family

## 2022-01-23 ENCOUNTER — Ambulatory Visit (INDEPENDENT_AMBULATORY_CARE_PROVIDER_SITE_OTHER): Payer: 59 | Admitting: Family

## 2022-01-23 VITALS — BP 134/79 | HR 57 | Temp 97.8°F | Ht 70.0 in | Wt 304.0 lb

## 2022-01-23 DIAGNOSIS — F411 Generalized anxiety disorder: Secondary | ICD-10-CM | POA: Diagnosis not present

## 2022-01-23 DIAGNOSIS — R69 Illness, unspecified: Secondary | ICD-10-CM | POA: Diagnosis not present

## 2022-01-23 DIAGNOSIS — I10 Essential (primary) hypertension: Secondary | ICD-10-CM

## 2022-01-23 NOTE — Patient Instructions (Signed)

## 2022-01-23 NOTE — Progress Notes (Signed)
Subjective:    Patient ID: Jerry Warren, male    DOB: September 13, 1980, 41 y.o.   MRN: [id number]  Chief Complaint  Patient presents with   Follow-up    GAD HTN, Wants to come off lexapro    PT presents to the office today to follow up on GAD. He was seen on 12/27/21 and started on Lexapro 10 mg and Buspar 5 mg TID prn. He states he has been taking the Buspar more regularly feels like this has been helping.   He states since starting he has had increased nightmares. He would like to stop it.  Anxiety Presents for follow-up visit. Symptoms include excessive worry, irritability and restlessness. Patient reports no depressed mood, nervous/anxious behavior or shortness of breath. Symptoms occur rarely. The severity of symptoms is mild.    Hypertension This is a chronic problem. The current episode started more than 1 year ago. The problem has been resolved since onset. The problem is controlled. Associated symptoms include anxiety. Pertinent negatives include no malaise/fatigue, peripheral edema or shortness of breath. Risk factors for coronary artery disease include dyslipidemia, male gender and obesity. The current treatment provides moderate improvement.      Review of Systems  Constitutional:  Positive for irritability. Negative for malaise/fatigue.  Respiratory:  Negative for shortness of breath.   Psychiatric/Behavioral:  The patient is not nervous/anxious.   All other systems reviewed and are negative.      Objective:   Physical Exam Vitals reviewed.  Constitutional:      General: He is not in acute distress.    Appearance: He is well-developed. He is obese.  HENT:     Head: Normocephalic.     Right Ear: Tympanic membrane normal.     Left Ear: Tympanic membrane normal.  Eyes:     General:        Right eye: No discharge.        Left eye: No discharge.     Pupils: Pupils are equal, round, and reactive to light.  Neck:     Thyroid: No thyromegaly.  Cardiovascular:      Rate and Rhythm: Normal rate and regular rhythm.     Heart sounds: Normal heart sounds. No murmur heard. Pulmonary:     Effort: Pulmonary effort is normal. No respiratory distress.     Breath sounds: Normal breath sounds. No wheezing.  Abdominal:     General: Bowel sounds are normal. There is no distension.     Palpations: Abdomen is soft.     Tenderness: There is no abdominal tenderness.  Musculoskeletal:        General: No tenderness. Normal range of motion.     Cervical back: Normal range of motion and neck supple.  Skin:    General: Skin is warm and dry.     Findings: No erythema or rash.  Neurological:     Mental Status: He is alert and oriented to person, place, and time.     Cranial Nerves: No cranial nerve deficit.     Deep Tendon Reflexes: Reflexes are normal and symmetric.  Psychiatric:        Behavior: Behavior normal.        Thought Content: Thought content normal.        Judgment: Judgment normal.       BP 134/79   Pulse (!) 57   Temp 97.8 F (36.6 C) (Temporal)   Ht 5\' 10"  (1.778 m)   Wt (!) 304 lb (137.9 kg)  BMI 43.62 kg/m      Assessment & Plan:  Jerry Warren comes in today with chief complaint of Follow-up (GAD HTN, Wants to come off lexapro )   1. Primary hypertension  2. GAD (generalized anxiety disorder)   Continue Buspar 5 mg TID prn  Decrease Lexapro to 5 mg for 5 days then 2.5 mg for 3 days.  Stress management  Follow up as needed   Jannifer Rodney, FNP

## 2022-01-24 ENCOUNTER — Ambulatory Visit: Payer: 59 | Admitting: Family

## 2022-03-20 ENCOUNTER — Other Ambulatory Visit: Payer: Self-pay | Admitting: Family

## 2022-03-20 DIAGNOSIS — M545 Low back pain, unspecified: Secondary | ICD-10-CM

## 2022-05-01 ENCOUNTER — Encounter: Payer: Self-pay | Admitting: Family

## 2022-05-01 ENCOUNTER — Ambulatory Visit (INDEPENDENT_AMBULATORY_CARE_PROVIDER_SITE_OTHER): Payer: 59 | Admitting: Family

## 2022-05-01 VITALS — BP 126/84 | HR 71 | Temp 98.6°F | Ht 70.0 in | Wt 324.8 lb

## 2022-05-01 DIAGNOSIS — R5383 Other fatigue: Secondary | ICD-10-CM

## 2022-05-01 DIAGNOSIS — Z6841 Body Mass Index (BMI) 40.0 and over, adult: Secondary | ICD-10-CM

## 2022-05-01 DIAGNOSIS — I10 Essential (primary) hypertension: Secondary | ICD-10-CM

## 2022-05-01 DIAGNOSIS — R0683 Snoring: Secondary | ICD-10-CM

## 2022-05-01 DIAGNOSIS — R400 Somnolence: Secondary | ICD-10-CM | POA: Diagnosis not present

## 2022-05-01 NOTE — Patient Instructions (Signed)
Sleep Apnea Sleep apnea is a condition in which breathing pauses or becomes shallow during sleep. People with sleep apnea usually snore loudly. They may have times when they gasp and stop breathing for 10 seconds or more during sleep. This may happen many times during the night. Sleep apnea disrupts your sleep and keeps your body from getting the rest that it needs. This condition can increase your risk of certain health problems, including: Heart attack. Stroke. Obesity. Type 2 diabetes. Heart failure. Irregular heartbeat. High blood pressure. The goal of treatment is to help you breathe normally again. What are the causes?  The most common cause of sleep apnea is a collapsed or blocked airway. There are three kinds of sleep apnea: Obstructive sleep apnea. This kind is caused by a blocked or collapsed airway. Central sleep apnea. This kind happens when the part of the brain that controls breathing does not send the correct signals to the muscles that control breathing. Mixed sleep apnea. This is a combination of obstructive and central sleep apnea. What increases the risk? You are more likely to develop this condition if you: Are overweight. Smoke. Have a smaller than normal airway. Are older. Are male. Drink alcohol. Take sedatives or tranquilizers. Have a family history of sleep apnea. Have a tongue or tonsils that are larger than normal. What are the signs or symptoms? Symptoms of this condition include: Trouble staying asleep. Loud snoring. Morning headaches. Waking up gasping. Dry mouth or sore throat in the morning. Daytime sleepiness and tiredness. If you have daytime fatigue because of sleep apnea, you may be more likely to have: Trouble concentrating. Forgetfulness. Irritability or mood swings. Personality changes. Feelings of depression. Sexual dysfunction. This may include loss of interest if you are male, or erectile dysfunction if you are male. How is this  diagnosed? This condition may be diagnosed with: A medical history. A physical exam. A series of tests that are done while you are sleeping (sleep study). These tests are usually done in a sleep lab, but they may also be done at home. How is this treated? Treatment for this condition aims to restore normal breathing and to ease symptoms during sleep. It may involve managing health issues that can affect breathing, such as high blood pressure or obesity. Treatment may include: Sleeping on your side. Using a decongestant if you have nasal congestion. Avoiding the use of depressants, including alcohol, sedatives, and narcotics. Losing weight if you are overweight. Making changes to your diet. Quitting smoking. Using a device to open your airway while you sleep, such as: An oral appliance. This is a custom-made mouthpiece that shifts your lower jaw forward. A continuous positive airway pressure (CPAP) device. This device blows air through a mask when you breathe out (exhale). A nasal expiratory positive airway pressure (EPAP) device. This device has valves that you put into each nostril. A bi-level positive airway pressure (BIPAP) device. This device blows air through a mask when you breathe in (inhale) and breathe out (exhale). Having surgery if other treatments do not work. During surgery, excess tissue is removed to create a wider airway. Follow these instructions at home: Lifestyle Make any lifestyle changes that your health care provider recommends. Eat a healthy, well-balanced diet. Take steps to lose weight if you are overweight. Avoid using depressants, including alcohol, sedatives, and narcotics. Do not use any products that contain nicotine or tobacco. These products include cigarettes, chewing tobacco, and vaping devices, such as e-cigarettes. If you need help quitting, ask   your health care provider. General instructions Take over-the-counter and prescription medicines only as told  by your health care provider. If you were given a device to open your airway while you sleep, use it only as told by your health care provider. If you are having surgery, make sure to tell your health care provider you have sleep apnea. You may need to bring your device with you. Keep all follow-up visits. This is important. Contact a health care provider if: The device that you received to open your airway during sleep is uncomfortable or does not seem to be working. Your symptoms do not improve. Your symptoms get worse. Get help right away if: You develop: Chest pain. Shortness of breath. Discomfort in your back, arms, or stomach. You have: Trouble speaking. Weakness on one side of your body. Drooping in your face. These symptoms may represent a serious problem that is an emergency. Do not wait to see if the symptoms will go away. Get medical help right away. Call your local emergency services (911 in the [place] Do not drive yourself to the hospital. Summary Sleep apnea is a condition in which breathing pauses or becomes shallow during sleep. The most common cause is a collapsed or blocked airway. The goal of treatment is to restore normal breathing and to ease symptoms during sleep. This information is not intended to replace advice given to you by your health care provider. Make sure you discuss any questions you have with your health care provider. Document Revised: 09/15/2020 Document Reviewed: 01/16/2020 Elsevier Patient Education  2023 Elsevier Inc.  

## 2022-05-01 NOTE — Progress Notes (Signed)
Subjective:    Patient ID: Jerry Warren, male    DOB: Aug 24, 1980, 42 y.o.   MRN: [id number]  Chief Complaint  Patient presents with   Sleep Apnea   Pt presents to the office today with complaints of waking up in the middle of the night with headaches and gasping for breaths. He is worried about sleep apnea.   Pt admits to snoring and day time sleepiness.  Hypertension This is a chronic problem. The current episode started more than 1 year ago. Associated symptoms include malaise/fatigue. Pertinent negatives include no peripheral edema (some times) or shortness of breath.      Review of Systems  Constitutional:  Positive for malaise/fatigue.  Respiratory:  Negative for shortness of breath.   All other systems reviewed and are negative.      Objective:   Physical Exam Vitals reviewed.  Constitutional:      General: He is not in acute distress.    Appearance: He is well-developed. He is obese.  HENT:     Head: Normocephalic.     Right Ear: Tympanic membrane normal.     Left Ear: Tympanic membrane normal.  Eyes:     General:        Right eye: No discharge.        Left eye: No discharge.     Pupils: Pupils are equal, round, and reactive to light.  Neck:     Thyroid: No thyromegaly.  Cardiovascular:     Rate and Rhythm: Normal rate and regular rhythm.     Heart sounds: Normal heart sounds. No murmur heard. Pulmonary:     Effort: Pulmonary effort is normal. No respiratory distress.     Breath sounds: Normal breath sounds. No wheezing.  Abdominal:     General: Bowel sounds are normal. There is no distension.     Palpations: Abdomen is soft.     Tenderness: There is no abdominal tenderness.  Musculoskeletal:        General: No tenderness. Normal range of motion.     Cervical back: Normal range of motion and neck supple.  Skin:    General: Skin is warm and dry.     Findings: No erythema or rash.  Neurological:     Mental Status: He is alert and oriented to person,  place, and time.     Cranial Nerves: No cranial nerve deficit.     Deep Tendon Reflexes: Reflexes are normal and symmetric.  Psychiatric:        Behavior: Behavior normal.        Thought Content: Thought content normal.        Judgment: Judgment normal.       BP 126/84   Pulse 71   Temp 98.6 F (37 C) (Temporal)   Ht '5\' 10"'$  (1.778 m)   Wt (!) 324 lb 12.8 oz (147.3 kg)   SpO2 98%   BMI 46.60 kg/m      Assessment & Plan:  JAGROOP PELLITTERI comes in today with chief complaint of Sleep Apnea   Diagnosis and orders addressed:  1. Snoring - Ambulatory referral to Sleep Studies  2. Daytime sleepiness - Ambulatory referral to Sleep Studies  3. Other fatigue  - Ambulatory referral to Sleep Studies  4. Primary hypertension - Ambulatory referral to Sleep Studies  5. Morbid obesity ([place]) - Ambulatory referral to Sleep Studies   Labs pending Health Maintenance reviewed Diet and exercise encouraged  Follow up plan: Keep chronic follow up  Evelina Dun, FNP

## 2022-06-05 ENCOUNTER — Ambulatory Visit: Payer: 59 | Admitting: Neurology

## 2022-06-05 ENCOUNTER — Encounter: Payer: Self-pay | Admitting: Neurology

## 2022-06-05 VITALS — BP 143/89 | HR 63 | Ht 70.0 in | Wt 323.0 lb

## 2022-06-05 DIAGNOSIS — G4719 Other hypersomnia: Secondary | ICD-10-CM

## 2022-06-05 DIAGNOSIS — Z6841 Body Mass Index (BMI) 40.0 and over, adult: Secondary | ICD-10-CM | POA: Diagnosis not present

## 2022-06-05 DIAGNOSIS — R062 Wheezing: Secondary | ICD-10-CM

## 2022-06-05 DIAGNOSIS — R42 Dizziness and giddiness: Secondary | ICD-10-CM

## 2022-06-05 DIAGNOSIS — R519 Headache, unspecified: Secondary | ICD-10-CM

## 2022-06-05 DIAGNOSIS — G43909 Migraine, unspecified, not intractable, without status migrainosus: Secondary | ICD-10-CM

## 2022-06-05 DIAGNOSIS — E66813 Obesity, class 3: Secondary | ICD-10-CM
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
## 2022-06-05 NOTE — Patient Instructions (Signed)
Quality Sleep Information, Adult Quality sleep is important for your mental and physical health. It also improves your quality of life. Quality sleep means you: Are asleep for most of the time you are in bed. Fall asleep within 30 minutes. Wake up no more than once a night. Are awake for no longer than 20 minutes if you do wake up during the night. Most adults need 7-8 hours of quality sleep each night. How can poor sleep affect me? If you do not get enough quality sleep, you may have: Mood swings. Daytime sleepiness. Decreased alertness, reaction time, and concentration. Sleep disorders, such as insomnia and sleep apnea. Difficulty with: Solving problems. Coping with stress. Paying attention. These issues may affect your performance and productivity at work, school, and home. Lack of sleep may also put you at higher risk for accidents, suicide, and risky behaviors. If you do not get quality sleep, you may also be at higher risk for several health problems, including: Infections. Type 2 diabetes. Heart disease. High blood pressure. Obesity. Worsening of long-term conditions, like arthritis, kidney disease, depression, Parkinson's disease, and epilepsy. What actions can I take to get more quality sleep? Sleep schedule and routine Stick to a sleep schedule. Go to sleep and wake up at about the same time each day. Do not try to sleep less on weekdays and make up for lost sleep on weekends. This does not work. Limit naps during the day to 30 minutes or less. Do not take naps in the late afternoon. Make time to relax before bed. Reading, listening to music, or taking a hot bath promotes quality sleep. Make your bedroom a place that promotes quality sleep. Keep your bedroom dark, quiet, and at a comfortable room temperature. Make sure your bed is comfortable. Avoid using electronic devices that give off bright blue light for 30 minutes before bedtime. Your brain perceives bright blue light  as sunlight. This includes television, phones, and computers. If you are lying awake in bed for longer than 20 minutes, get up and do a relaxing activity until you feel sleepy. Lifestyle     Try to get at least 30 minutes of exercise on most days. Do not exercise 2-3 hours before going to bed. Do not use any products that contain nicotine or tobacco. These products include cigarettes, chewing tobacco, and vaping devices, such as e-cigarettes. If you need help quitting, ask your health care provider. Do not drink caffeinated beverages for at least 8 hours before going to bed. Coffee, tea, and some sodas contain caffeine. Do not drink alcohol or eat large meals close to bedtime. Try to get at least 30 minutes of sunlight every day. Morning sunlight is best. Medical concerns Work with your health care provider to treat medical conditions that may affect sleeping, such as: Nasal obstruction. Snoring. Sleep apnea and other sleep disorders. Talk to your health care provider if you think any of your prescription medicines may cause you to have difficulty falling or staying asleep. If you have sleep problems, talk with a sleep consultant. If you think you have a sleep disorder, talk with your health care provider about getting evaluated by a specialist. Where to find more information Sleep Foundation: sleepfoundation.org American Academy of Sleep Medicine: aasm.org Centers for Disease Control and Prevention (CDC): cdc.gov Contact a health care provider if: You have trouble getting to sleep or staying asleep. You often wake up very early in the morning and cannot get back to sleep. You have daytime sleepiness. You   have daytime sleep attacks of suddenly falling asleep and sudden muscle weakness (narcolepsy). You have a tingling sensation in your legs with a strong urge to move your legs (restless legs syndrome). You stop breathing briefly during sleep (sleep apnea). You think you have a sleep  disorder or are taking a medicine that is affecting your quality of sleep. Summary Most adults need 7-8 hours of quality sleep each night. Getting enough quality sleep is important for your mental and physical health. Make your bedroom a place that promotes quality sleep, and avoid things that may cause you to have poor sleep, such as alcohol, caffeine, smoking, or large meals. Talk to your health care provider if you have trouble falling asleep or staying asleep. This information is not intended to replace advice given to you by your health care provider. Make sure you discuss any questions you have with your health care provider. Document Revised: 06/01/2021 Document Reviewed: 06/01/2021 Elsevier Patient Education  2023 Elsevier Inc. Insomnia Insomnia is a sleep disorder that makes it difficult to fall asleep or stay asleep. Insomnia can cause fatigue, low energy, difficulty concentrating, mood swings, and poor performance at work or school. There are three different ways to classify insomnia: Difficulty falling asleep. Difficulty staying asleep. Waking up too early in the morning. Any type of insomnia can be long-term (chronic) or short-term (acute). Both are common. Short-term insomnia usually lasts for 3 months or less. Chronic insomnia occurs at least three times a week for longer than 3 months. What are the causes? Insomnia may be caused by another condition, situation, or substance, such as: Having certain mental health conditions, such as anxiety and depression. Using caffeine, alcohol, tobacco, or drugs. Having gastrointestinal conditions, such as gastroesophageal reflux disease (GERD). Having certain medical conditions. These include: Asthma. Alzheimer's disease. Stroke. Chronic pain. An overactive thyroid gland (hyperthyroidism). Other sleep disorders, such as restless legs syndrome and sleep apnea. Menopause. Sometimes, the cause of insomnia may not be known. What  increases the risk? Risk factors for insomnia include: Gender. Females are affected more often than males. Age. Insomnia is more common as people get older. Stress and certain medical and mental health conditions. Lack of exercise. Having an irregular work schedule. This may include working night shifts and traveling between different time zones. What are the signs or symptoms? If you have insomnia, the main symptom is having trouble falling asleep or having trouble staying asleep. This may lead to other symptoms, such as: Feeling tired or having low energy. Feeling nervous about going to sleep. Not feeling rested in the morning. Having trouble concentrating. Feeling irritable, anxious, or depressed. How is this diagnosed? This condition may be diagnosed based on: Your symptoms and medical history. Your health care provider may ask about: Your sleep habits. Any medical conditions you have. Your mental health. A physical exam. How is this treated? Treatment for insomnia depends on the cause. Treatment may focus on treating an underlying condition that is causing the insomnia. Treatment may also include: Medicines to help you sleep. Counseling or therapy. Lifestyle adjustments to help you sleep better. Follow these instructions at home: Eating and drinking  Limit or avoid alcohol, caffeinated beverages, and products that contain nicotine and tobacco, especially close to bedtime. These can disrupt your sleep. Do not eat a large meal or eat spicy foods right before bedtime. This can lead to digestive discomfort that can make it hard for you to sleep. Sleep habits  Keep a sleep diary to help you and   your health care provider figure out what could be causing your insomnia. Write down: When you sleep. When you wake up during the night. How well you sleep and how rested you feel the next day. Any side effects of medicines you are taking. What you eat and drink. Make your bedroom a  dark, comfortable place where it is easy to fall asleep. Put up shades or blackout curtains to block light from outside. Use a white noise machine to block noise. Keep the temperature cool. Limit screen use before bedtime. This includes: Not watching TV. Not using your smartphone, tablet, or computer. Stick to a routine that includes going to bed and waking up at the same times every day and night. This can help you fall asleep faster. Consider making a quiet activity, such as reading, part of your nighttime routine. Try to avoid taking naps during the day so that you sleep better at night. Get out of bed if you are still awake after 15 minutes of trying to sleep. Keep the lights down, but try reading or doing a quiet activity. When you feel sleepy, go back to bed. General instructions Take over-the-counter and prescription medicines only as told by your health care provider. Exercise regularly as told by your health care provider. However, avoid exercising in the hours right before bedtime. Use relaxation techniques to manage stress. Ask your health care provider to suggest some techniques that may work well for you. These may include: Breathing exercises. Routines to release muscle tension. Visualizing peaceful scenes. Make sure that you drive carefully. Do not drive if you feel very sleepy. Keep all follow-up visits. This is important. Contact a health care provider if: You are tired throughout the day. You have trouble in your daily routine due to sleepiness. You continue to have sleep problems, or your sleep problems get worse. Get help right away if: You have thoughts about hurting yourself or someone else. Get help right away if you feel like you may hurt yourself or others, or have thoughts about taking your own life. Go to your nearest emergency room or: Call 911. Call the National Suicide Prevention Lifeline at 1-800-273-8255 or 988. This is open 24 hours a day. Text the Crisis  Text Line at 741741. Summary Insomnia is a sleep disorder that makes it difficult to fall asleep or stay asleep. Insomnia can be long-term (chronic) or short-term (acute). Treatment for insomnia depends on the cause. Treatment may focus on treating an underlying condition that is causing the insomnia. Keep a sleep diary to help you and your health care provider figure out what could be causing your insomnia. This information is not intended to replace advice given to you by your health care provider. Make sure you discuss any questions you have with your health care provider. Document Revised: 01/17/2021 Document Reviewed: 01/17/2021 Elsevier Patient Education  2023 Elsevier Inc.  

## 2022-06-08 ENCOUNTER — Telehealth: Payer: Self-pay | Admitting: Neurology

## 2022-06-08 NOTE — Telephone Encounter (Signed)
MAILOUT HST - [employer] no auth req   On the schedule for 06/14/2022.

## 2022-06-14 ENCOUNTER — Ambulatory Visit: Payer: 59 | Admitting: Neurology

## 2022-06-14 DIAGNOSIS — G43909 Migraine, unspecified, not intractable, without status migrainosus: Secondary | ICD-10-CM

## 2022-06-14 DIAGNOSIS — G4719 Other hypersomnia: Secondary | ICD-10-CM

## 2022-06-14 DIAGNOSIS — T7840XA Allergy, unspecified, initial encounter: Secondary | ICD-10-CM

## 2022-06-14 DIAGNOSIS — R42 Dizziness and giddiness: Secondary | ICD-10-CM

## 2022-06-14 DIAGNOSIS — G4733 Obstructive sleep apnea (adult) (pediatric): Secondary | ICD-10-CM

## 2022-06-14 DIAGNOSIS — R519 Headache, unspecified: Secondary | ICD-10-CM

## 2022-06-17 ENCOUNTER — Other Ambulatory Visit: Payer: Self-pay | Admitting: Family

## 2022-06-17 DIAGNOSIS — F410 Panic disorder [episodic paroxysmal anxiety] without agoraphobia: Secondary | ICD-10-CM

## 2022-06-17 DIAGNOSIS — F411 Generalized anxiety disorder: Secondary | ICD-10-CM

## 2022-06-19 ENCOUNTER — Other Ambulatory Visit: Payer: Self-pay | Admitting: Family

## 2022-06-19 DIAGNOSIS — F410 Panic disorder [episodic paroxysmal anxiety] without agoraphobia: Secondary | ICD-10-CM

## 2022-06-19 DIAGNOSIS — F411 Generalized anxiety disorder: Secondary | ICD-10-CM

## 2022-06-20 NOTE — Progress Notes (Signed)
[facility]    Jerry Warren 42 year old male [birth year]  [place], [place]  HOME SLEEP TEST REPORT ( by Watch PAT)   STUDY DATE:  4.30.2024   ORDERING CLINICIAN: Melvyn Novas, MD  REFERRING CLINICIAN: PCP Lendon Colonel, NP    CLINICAL INFORMATION/HISTORY: 06-05-2022: Symptom of excessive daytime sleepiness,NEW SLEEP CONSULT PATIENT ref by PCP -reports he has been waking up with headaches. He also wakes up gasping for air in the middle of the night. He doesn't have trouble falling asleep -Symptoms started about a year ago. Maternal Grandmother and maternal uncle have been diagnosed with OSA.  " I am woken up by severe headaches, not every night, and these started several month ago. I wear sunglasses for photophobia.I have always been big, but now gained my peak weight over the last 3 years "       Epworth sleepiness score: 11-12 /24. FSS at 40/ 63 points.    BMI: 46. 3 kg/m   Neck Circumference: 20"   FINDINGS:   Sleep Summary:   Total Recording Time (hours, min): 7 hours 32 minutes        Total Sleep Time (hours, min):    7 hours 1 minute             Percent REM (%):   29%    Sleep latency was 40 minutes and REM sleep latency 97 minutes, there were 5 periods of wakefulness. See graph.                                    Respiratory Indices by AASM criteria:   Calculated pAHI (per hour): 54.7/h                           REM pAHI:   89.4/h                                              NREM pAHI: 40.7/h                            Positional AHI:   This patient sleeps with an elevated head of bed and spent most of the test time in supine position for 444 minutes with an AHI of 73.6/h, followed by 134 minutes in right lateral position with an AHI of only 9/h and 30 minutes in the left position with an AHI of 86.1/h.  Snoring reached a mean volume of 52 dB which is very loud and snoring was present for almost 80% of the recorded sleep time.                                                 Oxygen Saturation Statistics:  O2 Saturation Range (%):   Between a nadir at 66% and a maximum of 98% with a mean saturation of 90%                                    O2  Saturation (minutes) <89%:   50.7 minutes, the equivalent of 12% of total sleep time.  This is a critical level. O2 saturation (minutes) < 90%  : 72 minutes , 17% of total sleep time.       Pulse Rate Statistics:   Pulse Mean (bpm): 50 bpm               Pulse Range: Between 35 and 102 bpm, there is intermittent bradycardia to a severe degree.               IMPRESSION:  This HST confirms the presence of severe sleep apnea which reached 120% increase during REM sleep in comparison to non-REM sleep, with the most severe drop in oxygen saturation during REM sleep and supine sleep.   RECOMMENDATION: This severity of sleep apnea is rare and the associated prolonged total hypoxia time and bradycardia at to the severity of the problem. This apnea cannot be treated with an inspire device or dental device, and will require CPAP or possibly BiPAP therapy. It may also be necessary to add oxygen showed positive airway pressure therapy not eliminate hypoxia sufficiently. I would much prefer to have this patient in reasonably time titrated in the sleep laboratory, but can only place him on a cancellation list for now. In order to initiate treatment as soon as possible, I will order an auto titration CPAP device with humidifier, pressure to be set between 5 and 20 cm water pressure with 3 cm expiratory relief and interface to be fitted with the patient in reclined position. I also need to obtain an overnight pulse oximetry within 30 days of CPAP use. This overnight pulse oximetry should be performed while the patient is on CPAP and is meant to document if hypoxia is sufficiently treated and if bradycardia has improved-both are measures for sufficient treatment of apnea.     INTERPRETING PHYSICIAN:   Melvyn Novas, MD    AASM certified and accredited facility: [facility].

## 2022-06-23 ENCOUNTER — Telehealth: Payer: Self-pay | Admitting: Neurology

## 2022-06-23 NOTE — Telephone Encounter (Signed)
Pt called. Stated it's been a week and he is following-up on results on sleep study.

## 2022-06-25 ENCOUNTER — Other Ambulatory Visit: Payer: Self-pay | Admitting: Neurology

## 2022-06-25 DIAGNOSIS — R42 Dizziness and giddiness: Secondary | ICD-10-CM | POA: Insufficient documentation

## 2022-06-25 DIAGNOSIS — G4733 Obstructive sleep apnea (adult) (pediatric): Secondary | ICD-10-CM | POA: Insufficient documentation

## 2022-06-25 MED ORDER — ARMODAFINIL 250 MG PO TABS: 250.0000 mg | ORAL_TABLET | Freq: Every day | ORAL | 5 refills | Status: DC

## 2022-06-25 NOTE — Procedures (Signed)
     [facility]    Jerry Warren 42 year old male 08/16/1980  [place], [place]  HOME SLEEP TEST REPORT ( by Watch PAT)   STUDY DATE:  4.30.2024   ORDERING CLINICIAN: Maryalice Pasley, MD  REFERRING CLINICIAN: PCP Hawks, NP    CLINICAL INFORMATION/HISTORY: 06-05-2022: Symptom of excessive daytime sleepiness,NEW SLEEP CONSULT PATIENT ref by PCP -reports he has been waking up with headaches. He also wakes up gasping for air in the middle of the night. He doesn't have trouble falling asleep -Symptoms started about a year ago. Maternal Grandmother and maternal uncle have been diagnosed with OSA.  " I am woken up by severe headaches, not every night, and these started several month ago. I wear sunglasses for photophobia.I have always been big, but now gained my peak weight over the last 3 years "       Epworth sleepiness score: 11-12 /24. FSS at 40/ 63 points.    BMI: 46. 3 kg/m   Neck Circumference: 20"   FINDINGS:   Sleep Summary:   Total Recording Time (hours, min): 7 hours 32 minutes        Total Sleep Time (hours, min):    7 hours 1 minute             Percent REM (%):   29%    Sleep latency was 40 minutes and REM sleep latency 97 minutes, there were 5 periods of wakefulness. See graph.                                    Respiratory Indices by AASM criteria:   Calculated pAHI (per hour): 54.7/h                           REM pAHI:   89.4/h                                              NREM pAHI: 40.7/h                            Positional AHI:   This patient sleeps with an elevated head of bed and spent most of the test time in supine position for 444 minutes with an AHI of 73.6/h, followed by 134 minutes in right lateral position with an AHI of only 9/h and 30 minutes in the left position with an AHI of 86.1/h.  Snoring reached a mean volume of 52 dB which is very loud and snoring was present for almost 80% of the recorded sleep time.                                                 Oxygen Saturation Statistics:  O2 Saturation Range (%):   Between a nadir at 66% and a maximum of 98% with a mean saturation of 90%                                    O2   Saturation (minutes) <89%:   50.7 minutes, the equivalent of 12% of total sleep time.  This is a critical level. O2 saturation (minutes) < 90%  : 72 minutes , 17% of total sleep time.       Pulse Rate Statistics:   Pulse Mean (bpm): 50 bpm               Pulse Range: Between 35 and 102 bpm, there is intermittent bradycardia to a severe degree.               IMPRESSION:  This HST confirms the presence of severe sleep apnea which reached 120% increase during REM sleep in comparison to non-REM sleep, with the most severe drop in oxygen saturation during REM sleep and supine sleep.   RECOMMENDATION: This severity of sleep apnea is rare and the associated prolonged total hypoxia time and bradycardia at to the severity of the problem. This apnea cannot be treated with an inspire device or dental device, and will require CPAP or possibly BiPAP therapy. It may also be necessary to add oxygen showed positive airway pressure therapy not eliminate hypoxia sufficiently. I would much prefer to have this patient in reasonably time titrated in the sleep laboratory, but can only place him on a cancellation list for now. In order to initiate treatment as soon as possible, I will order an auto titration CPAP device with humidifier, pressure to be set between 5 and 20 cm water pressure with 3 cm expiratory relief and interface to be fitted with the patient in reclined position. I also need to obtain an overnight pulse oximetry within 30 days of CPAP use. This overnight pulse oximetry should be performed while the patient is on CPAP and is meant to document if hypoxia is sufficiently treated and if bradycardia has improved-both are measures for sufficient treatment of apnea.     INTERPRETING PHYSICIAN:   Makenlee Mckeag, MD    AASM certified and accredited facility: [facility].                        

## 2022-06-25 NOTE — Progress Notes (Signed)
For his excessive daytime sleepiness, I will be able to order Armodafinil at 250 mg with the now established dx of OSA.

## 2022-06-25 NOTE — Progress Notes (Signed)
HST revealed severe OSA with severe O2 desat. I will have to start him on auto CPAP and have ordered factory settings , humidifier, mask fitting and a follow up ONO within 30 days on PAP for this patient - we could not get an in lab study in a reasonable time window-   If the O2 stays low on PAP or if he is not tolerant of CPAP, he will need an attended CPAP - BiPAP titration and possibly an 02 titration in lab.  I asked DME to inform us of any compliance problems within the first 30 days. TY  For his excessive daytime sleepiness, I will be able to order Armodafinil at 250 mg with the now established dx of OSA.  I have informed the patient of the CPAP order, ONO order and the Armodafinil.

## 2022-06-26 NOTE — Telephone Encounter (Signed)
I called pt. I advised pt that Dr. Vickey Huger reviewed their sleep study results and found that pt has severe OSA. Dr. Vickey Huger  recommends that pt starts auto CPAP. I reviewed PAP compliance expectations with the pt. Pt is agreeable to starting a CPAP. I advised pt that an order will be sent to a DME, [facility], and [facility] will call the pt within about one week after they file with the pt's insurance. [facility] will show the pt how to use the machine, fit for masks, and troubleshoot the CPAP if needed. A follow up appt was made for insurance purposes with Dr. Vickey Huger on 09/14/2022 at 3:30 pm checking in 3 p. Pt verbalized understanding to arrive 15 minutes early and bring their CPAP. Pt verbalized understanding of results. Pt had no questions at this time but was encouraged to call back if questions arise. I have sent the order to [facility] and have received confirmation that they have received the order.

## 2022-07-11 DIAGNOSIS — G4733 Obstructive sleep apnea (adult) (pediatric): Secondary | ICD-10-CM | POA: Diagnosis not present

## 2022-07-12 ENCOUNTER — Other Ambulatory Visit: Payer: Self-pay | Admitting: Family

## 2022-07-12 DIAGNOSIS — M545 Low back pain, unspecified: Secondary | ICD-10-CM

## 2022-07-12 DIAGNOSIS — F410 Panic disorder [episodic paroxysmal anxiety] without agoraphobia: Secondary | ICD-10-CM

## 2022-07-12 DIAGNOSIS — F411 Generalized anxiety disorder: Secondary | ICD-10-CM

## 2022-07-16 ENCOUNTER — Telehealth: Payer: Self-pay

## 2022-07-16 ENCOUNTER — Other Ambulatory Visit (HOSPITAL_COMMUNITY): Payer: Self-pay

## 2022-07-16 NOTE — Telephone Encounter (Signed)
Pharmacy Patient Advocate Encounter  Prior Authorization for Armodafinil 250MG  tablets has been approved by [employer] (ins).   Key: [id number]   PA # PA Case ID: [id number] Effective dates: N/A through N/A Dates were not listed on CMM-will update once we receive the approval letter.  Spoke with Pharmacy to process. Copay is $0 per test claim

## 2022-07-18 ENCOUNTER — Other Ambulatory Visit: Payer: Self-pay | Admitting: Family

## 2022-07-18 ENCOUNTER — Other Ambulatory Visit (HOSPITAL_COMMUNITY): Payer: Self-pay

## 2022-07-18 DIAGNOSIS — F410 Panic disorder [episodic paroxysmal anxiety] without agoraphobia: Secondary | ICD-10-CM

## 2022-07-18 DIAGNOSIS — F411 Generalized anxiety disorder: Secondary | ICD-10-CM

## 2022-07-18 MED ORDER — BUSPIRONE HCL 5 MG PO TABS
5.0000 mg | ORAL_TABLET | Freq: Three times a day (TID) | ORAL | 4 refills | Status: DC | PRN
Start: 2022-07-18 — End: 2023-01-03

## 2022-07-18 NOTE — Telephone Encounter (Signed)
From: Jyl Heinz To: Office of [place], [place] Sent: 07/15/2022 8:43 AM EDT Subject: Medication Renewal Request  Refills have been requested for the following medications:   busPIRone (BUSPAR) 5 MG tablet [Marlaya Turck]  Patient Comment: I do not need to be seen before this medication can be refilled. She told me face to face that if I felt okay I would only need to be seen once a year. It's only been 2 months and I'm okay.It's Unnecessary to have to waste her time and mine to have a prescription refilled. And the same thing is true for my other medications which still do have refills.   Preferred pharmacy: [id number] - [place], [place] - [address] Delivery method: [employer]

## 2022-07-18 NOTE — Telephone Encounter (Signed)
Buspar Prescription sent to pharmacy   

## 2022-07-18 NOTE — Telephone Encounter (Signed)
FYI for POD 3. Dr. Vickey Huger pt.

## 2022-08-02 DIAGNOSIS — G4733 Obstructive sleep apnea (adult) (pediatric): Secondary | ICD-10-CM | POA: Diagnosis not present

## 2022-08-11 DIAGNOSIS — G4733 Obstructive sleep apnea (adult) (pediatric): Secondary | ICD-10-CM | POA: Diagnosis not present

## 2022-08-13 ENCOUNTER — Other Ambulatory Visit: Payer: Self-pay | Admitting: Family

## 2022-08-13 DIAGNOSIS — M545 Low back pain, unspecified: Secondary | ICD-10-CM

## 2022-09-08 ENCOUNTER — Other Ambulatory Visit: Payer: Self-pay | Admitting: Family

## 2022-09-08 DIAGNOSIS — I10 Essential (primary) hypertension: Secondary | ICD-10-CM

## 2022-09-10 DIAGNOSIS — G4733 Obstructive sleep apnea (adult) (pediatric): Secondary | ICD-10-CM | POA: Diagnosis not present

## 2022-09-12 ENCOUNTER — Other Ambulatory Visit: Payer: Self-pay | Admitting: *Deleted

## 2022-09-12 DIAGNOSIS — R1084 Generalized abdominal pain: Secondary | ICD-10-CM

## 2022-09-14 ENCOUNTER — Ambulatory Visit: Payer: 59 | Admitting: Neurology

## 2022-09-14 ENCOUNTER — Encounter: Payer: Self-pay | Admitting: Neurology

## 2022-09-14 VITALS — BP 146/74 | HR 58 | Ht 70.0 in | Wt 333.2 lb

## 2022-09-14 DIAGNOSIS — G4733 Obstructive sleep apnea (adult) (pediatric): Secondary | ICD-10-CM | POA: Diagnosis not present

## 2022-09-14 DIAGNOSIS — Z6841 Body Mass Index (BMI) 40.0 and over, adult: Secondary | ICD-10-CM

## 2022-09-14 DIAGNOSIS — R062 Wheezing: Secondary | ICD-10-CM

## 2022-09-14 DIAGNOSIS — T7840XA Allergy, unspecified, initial encounter: Secondary | ICD-10-CM

## 2022-09-14 NOTE — Patient Instructions (Signed)
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
## 2022-09-18 ENCOUNTER — Telehealth: Payer: Self-pay

## 2022-10-11 DIAGNOSIS — G4733 Obstructive sleep apnea (adult) (pediatric): Secondary | ICD-10-CM | POA: Diagnosis not present

## 2022-11-06 DIAGNOSIS — G4733 Obstructive sleep apnea (adult) (pediatric): Secondary | ICD-10-CM | POA: Diagnosis not present

## 2022-11-11 DIAGNOSIS — G4733 Obstructive sleep apnea (adult) (pediatric): Secondary | ICD-10-CM | POA: Diagnosis not present

## 2022-12-06 ENCOUNTER — Other Ambulatory Visit: Payer: Self-pay | Admitting: Family

## 2022-12-06 DIAGNOSIS — I10 Essential (primary) hypertension: Secondary | ICD-10-CM

## 2022-12-07 ENCOUNTER — Other Ambulatory Visit: Payer: Self-pay | Admitting: Family

## 2022-12-07 DIAGNOSIS — I10 Essential (primary) hypertension: Secondary | ICD-10-CM

## 2022-12-07 NOTE — Telephone Encounter (Signed)
Called pt to schedule appt, stated he will schedule that when he has time because he is at work

## 2022-12-07 NOTE — Telephone Encounter (Signed)
Yes okay to go ahead and give a 30-day prescription and make her an appointment but she needs to be seen before it runs out.

## 2022-12-07 NOTE — Telephone Encounter (Signed)
[facility] NTBS 30 days given today

## 2022-12-11 DIAGNOSIS — G4733 Obstructive sleep apnea (adult) (pediatric): Secondary | ICD-10-CM | POA: Diagnosis not present

## 2023-01-03 ENCOUNTER — Other Ambulatory Visit: Payer: Self-pay | Admitting: Family

## 2023-01-03 DIAGNOSIS — F411 Generalized anxiety disorder: Secondary | ICD-10-CM

## 2023-01-03 DIAGNOSIS — F410 Panic disorder [episodic paroxysmal anxiety] without agoraphobia: Secondary | ICD-10-CM

## 2023-01-03 DIAGNOSIS — I10 Essential (primary) hypertension: Secondary | ICD-10-CM

## 2023-01-03 NOTE — Addendum Note (Signed)
Addended by: Julious Payer D on: 01/03/2023 09:29 AM   Modules accepted: Orders

## 2023-01-03 NOTE — Telephone Encounter (Signed)
Please advise 04/2022 OV has no RTC

## 2023-01-03 NOTE — Telephone Encounter (Signed)
[facility] pt NTBS 30-d given 12/07/22

## 2023-01-03 NOTE — Telephone Encounter (Signed)
Pt states he was told by Neysa Bonito that he does need to be seen until March 2025, and she would refill his medications for a year.

## 2023-01-04 ENCOUNTER — Other Ambulatory Visit: Payer: Self-pay | Admitting: Family

## 2023-01-04 ENCOUNTER — Other Ambulatory Visit: Payer: Self-pay | Admitting: Family Medicine

## 2023-01-04 DIAGNOSIS — I10 Essential (primary) hypertension: Secondary | ICD-10-CM

## 2023-01-04 DIAGNOSIS — G8929 Other chronic pain: Secondary | ICD-10-CM

## 2023-01-04 MED ORDER — METOPROLOL TARTRATE 25 MG PO TABS: 25.0000 mg | ORAL_TABLET | Freq: Two times a day (BID) | ORAL | 1 refills | Status: DC

## 2023-01-04 MED ORDER — HYDROCHLOROTHIAZIDE 25 MG PO TABS
25.0000 mg | ORAL_TABLET | Freq: Every day | ORAL | 0 refills | Status: DC
Start: 2023-01-04 — End: 2023-02-01

## 2023-01-04 MED ORDER — BUSPIRONE HCL 5 MG PO TABS: 5.0000 mg | ORAL_TABLET | Freq: Three times a day (TID) | ORAL | 1 refills | Status: DC | PRN

## 2023-01-04 MED ORDER — METOPROLOL TARTRATE 25 MG PO TABS
25.0000 mg | ORAL_TABLET | Freq: Two times a day (BID) | ORAL | 0 refills | Status: DC
Start: 2023-01-04 — End: 2023-02-01

## 2023-01-04 MED ORDER — LOSARTAN POTASSIUM 50 MG PO TABS
50.0000 mg | ORAL_TABLET | Freq: Every day | ORAL | 0 refills | Status: DC
Start: 2023-01-04 — End: 2023-02-01

## 2023-01-11 DIAGNOSIS — G4733 Obstructive sleep apnea (adult) (pediatric): Secondary | ICD-10-CM | POA: Diagnosis not present

## 2023-01-30 ENCOUNTER — Other Ambulatory Visit: Payer: Self-pay | Admitting: Family

## 2023-01-30 DIAGNOSIS — I10 Essential (primary) hypertension: Secondary | ICD-10-CM

## 2023-02-01 ENCOUNTER — Other Ambulatory Visit: Payer: Self-pay | Admitting: Family

## 2023-02-01 DIAGNOSIS — I10 Essential (primary) hypertension: Secondary | ICD-10-CM

## 2023-02-01 MED ORDER — LOSARTAN POTASSIUM 50 MG PO TABS: 50.0000 mg | ORAL_TABLET | Freq: Every day | ORAL | 1 refills | Status: DC

## 2023-02-01 MED ORDER — HYDROCHLOROTHIAZIDE 25 MG PO TABS
25.0000 mg | ORAL_TABLET | Freq: Every day | ORAL | 1 refills | Status: DC
Start: 2023-02-01 — End: 2023-04-30

## 2023-02-01 MED ORDER — METOPROLOL TARTRATE 25 MG PO TABS: 25.0000 mg | ORAL_TABLET | Freq: Two times a day (BID) | ORAL | 1 refills | Status: DC

## 2023-02-10 DIAGNOSIS — G4733 Obstructive sleep apnea (adult) (pediatric): Secondary | ICD-10-CM | POA: Diagnosis not present

## 2023-02-25 ENCOUNTER — Other Ambulatory Visit: Payer: Self-pay | Admitting: Family

## 2023-02-25 DIAGNOSIS — F411 Generalized anxiety disorder: Secondary | ICD-10-CM

## 2023-02-25 DIAGNOSIS — F410 Panic disorder [episodic paroxysmal anxiety] without agoraphobia: Secondary | ICD-10-CM

## 2023-03-13 DIAGNOSIS — G4733 Obstructive sleep apnea (adult) (pediatric): Secondary | ICD-10-CM | POA: Diagnosis not present

## 2023-03-24 ENCOUNTER — Other Ambulatory Visit: Payer: Self-pay | Admitting: Family

## 2023-03-24 DIAGNOSIS — F411 Generalized anxiety disorder: Secondary | ICD-10-CM

## 2023-03-24 DIAGNOSIS — F410 Panic disorder [episodic paroxysmal anxiety] without agoraphobia: Secondary | ICD-10-CM

## 2023-04-05 DIAGNOSIS — G4733 Obstructive sleep apnea (adult) (pediatric): Secondary | ICD-10-CM | POA: Diagnosis not present

## 2023-04-13 DIAGNOSIS — G4733 Obstructive sleep apnea (adult) (pediatric): Secondary | ICD-10-CM | POA: Diagnosis not present

## 2023-04-30 ENCOUNTER — Encounter: Payer: Self-pay | Admitting: Family

## 2023-04-30 ENCOUNTER — Ambulatory Visit (INDEPENDENT_AMBULATORY_CARE_PROVIDER_SITE_OTHER): Payer: Self-pay | Admitting: Family

## 2023-04-30 VITALS — BP 141/82 | HR 60 | Temp 97.0°F | Ht 70.0 in | Wt 330.4 lb

## 2023-04-30 DIAGNOSIS — Z6841 Body Mass Index (BMI) 40.0 and over, adult: Secondary | ICD-10-CM

## 2023-04-30 DIAGNOSIS — Z0000 Encounter for general adult medical examination without abnormal findings: Secondary | ICD-10-CM

## 2023-04-30 DIAGNOSIS — G4734 Idiopathic sleep related nonobstructive alveolar hypoventilation: Secondary | ICD-10-CM | POA: Diagnosis not present

## 2023-04-30 DIAGNOSIS — K0889 Other specified disorders of teeth and supporting structures: Secondary | ICD-10-CM | POA: Diagnosis not present

## 2023-04-30 DIAGNOSIS — F411 Generalized anxiety disorder: Secondary | ICD-10-CM | POA: Diagnosis not present

## 2023-04-30 DIAGNOSIS — K047 Periapical abscess without sinus: Secondary | ICD-10-CM

## 2023-04-30 DIAGNOSIS — Z0001 Encounter for general adult medical examination with abnormal findings: Secondary | ICD-10-CM

## 2023-04-30 DIAGNOSIS — F410 Panic disorder [episodic paroxysmal anxiety] without agoraphobia: Secondary | ICD-10-CM

## 2023-04-30 DIAGNOSIS — K2100 Gastro-esophageal reflux disease with esophagitis, without bleeding: Secondary | ICD-10-CM

## 2023-04-30 DIAGNOSIS — I10 Essential (primary) hypertension: Secondary | ICD-10-CM

## 2023-04-30 DIAGNOSIS — M545 Low back pain, unspecified: Secondary | ICD-10-CM | POA: Diagnosis not present

## 2023-04-30 DIAGNOSIS — G4733 Obstructive sleep apnea (adult) (pediatric): Secondary | ICD-10-CM

## 2023-04-30 DIAGNOSIS — Z136 Encounter for screening for cardiovascular disorders: Secondary | ICD-10-CM | POA: Diagnosis not present

## 2023-04-30 DIAGNOSIS — E66813 Obesity, class 3: Secondary | ICD-10-CM | POA: Diagnosis not present

## 2023-04-30 MED ORDER — HYDROCHLOROTHIAZIDE 25 MG PO TABS: 25.0000 mg | ORAL_TABLET | Freq: Every day | ORAL | 2 refills | Status: DC

## 2023-04-30 MED ORDER — METOPROLOL TARTRATE 25 MG PO TABS: 25.0000 mg | ORAL_TABLET | Freq: Two times a day (BID) | ORAL | 1 refills | Status: DC

## 2023-04-30 MED ORDER — FAMOTIDINE 20 MG PO TABS: 20.0000 mg | ORAL_TABLET | Freq: Two times a day (BID) | ORAL | 3 refills | Status: DC

## 2023-04-30 MED ORDER — ESCITALOPRAM OXALATE 10 MG PO TABS: 10.0000 mg | ORAL_TABLET | Freq: Every day | ORAL | 2 refills | Status: DC

## 2023-04-30 MED ORDER — AMOXICILLIN-POT CLAVULANATE 875-125 MG PO TABS: 1.0000 | ORAL_TABLET | Freq: Two times a day (BID) | ORAL | 0 refills | Status: DC

## 2023-04-30 MED ORDER — BUSPIRONE HCL 5 MG PO TABS: 5.0000 mg | ORAL_TABLET | Freq: Three times a day (TID) | ORAL | 2 refills | Status: DC | PRN

## 2023-04-30 MED ORDER — BACLOFEN 10 MG PO TABS: 10.0000 mg | ORAL_TABLET | Freq: Three times a day (TID) | ORAL | 4 refills | Status: DC

## 2023-04-30 MED ORDER — LOSARTAN POTASSIUM 100 MG PO TABS: 100.0000 mg | ORAL_TABLET | Freq: Every day | ORAL | 1 refills | Status: DC

## 2023-04-30 NOTE — Patient Instructions (Signed)
 Hypertension, Adult High blood pressure (hypertension) is when the force of blood pumping through the arteries is too strong. The arteries are the blood vessels that carry blood from the heart throughout the body. Hypertension forces the heart to work harder to pump blood and may cause arteries to become narrow or stiff. Untreated or uncontrolled hypertension can lead to a heart attack, heart failure, a stroke, kidney disease, and other problems. A blood pressure reading consists of a higher number over a lower number. Ideally, your blood pressure should be below 120/80. The first ("top") number is called the systolic pressure. It is a measure of the pressure in your arteries as your heart beats. The second ("bottom") number is called the diastolic pressure. It is a measure of the pressure in your arteries as the heart relaxes. What are the causes? The exact cause of this condition is not known. There are some conditions that result in high blood pressure. What increases the risk? Certain factors may make you more likely to develop high blood pressure. Some of these risk factors are under your control, including: Smoking. Not getting enough exercise or physical activity. Being overweight. Having too much fat, sugar, calories, or salt (sodium) in your diet. Drinking too much alcohol. Other risk factors include: Having a personal history of heart disease, diabetes, high cholesterol, or kidney disease. Stress. Having a family history of high blood pressure and high cholesterol. Having obstructive sleep apnea. Age. The risk increases with age. What are the signs or symptoms? High blood pressure may not cause symptoms. Very high blood pressure (hypertensive crisis) may cause: Headache. Fast or irregular heartbeats (palpitations). Shortness of breath. Nosebleed. Nausea and vomiting. Vision changes. Severe chest pain, dizziness, and seizures. How is this diagnosed? This condition is diagnosed by  measuring your blood pressure while you are seated, with your arm resting on a flat surface, your legs uncrossed, and your feet flat on the floor. The cuff of the blood pressure monitor will be placed directly against the skin of your upper arm at the level of your heart. Blood pressure should be measured at least twice using the same arm. Certain conditions can cause a difference in blood pressure between your right and left arms. If you have a high blood pressure reading during one visit or you have normal blood pressure with other risk factors, you may be asked to: Return on a different day to have your blood pressure checked again. Monitor your blood pressure at home for 1 week or longer. If you are diagnosed with hypertension, you may have other blood or imaging tests to help your health care provider understand your overall risk for other conditions. How is this treated? This condition is treated by making healthy lifestyle changes, such as eating healthy foods, exercising more, and reducing your alcohol intake. You may be referred for counseling on a healthy diet and physical activity. Your health care provider may prescribe medicine if lifestyle changes are not enough to get your blood pressure under control and if: Your systolic blood pressure is above 130. Your diastolic blood pressure is above 80. Your personal target blood pressure may vary depending on your medical conditions, your age, and other factors. Follow these instructions at home: Eating and drinking  Eat a diet that is high in fiber and potassium, and low in sodium, added sugar, and fat. An example of this eating plan is called the DASH diet. DASH stands for Dietary Approaches to Stop Hypertension. To eat this way: Eat  plenty of fresh fruits and vegetables. Try to fill one half of your plate at each meal with fruits and vegetables. Eat whole grains, such as whole-wheat pasta, brown rice, or whole-grain bread. Fill about one  fourth of your plate with whole grains. Eat or drink low-fat dairy products, such as skim milk or low-fat yogurt. Avoid fatty cuts of meat, processed or cured meats, and poultry with skin. Fill about one fourth of your plate with lean proteins, such as fish, chicken without skin, beans, eggs, or tofu. Avoid pre-made and processed foods. These tend to be higher in sodium, added sugar, and fat. Reduce your daily sodium intake. Many people with hypertension should eat less than 1,500 mg of sodium a day. Do not drink alcohol if: Your health care provider tells you not to drink. You are pregnant, may be pregnant, or are planning to become pregnant. If you drink alcohol: Limit how much you have to: 0-1 drink a day for women. 0-2 drinks a day for men. Know how much alcohol is in your drink. In the [place] one drink equals one 12 oz bottle of beer (355 mL), one 5 oz glass of wine (148 mL), or one 1 oz glass of hard liquor (44 mL). Lifestyle  Work with your health care provider to maintain a healthy body weight or to lose weight. Ask what an ideal weight is for you. Get at least 30 minutes of exercise that causes your heart to beat faster (aerobic exercise) most days of the week. Activities may include walking, swimming, or biking. Include exercise to strengthen your muscles (resistance exercise), such as Pilates or lifting weights, as part of your weekly exercise routine. Try to do these types of exercises for 30 minutes at least 3 days a week. Do not use any products that contain nicotine or tobacco. These products include cigarettes, chewing tobacco, and vaping devices, such as e-cigarettes. If you need help quitting, ask your health care provider. Monitor your blood pressure at home as told by your health care provider. Keep all follow-up visits. This is important. Medicines Take over-the-counter and prescription medicines only as told by your health care provider. Follow directions carefully. Blood  pressure medicines must be taken as prescribed. Do not skip doses of blood pressure medicine. Doing this puts you at risk for problems and can make the medicine less effective. Ask your health care provider about side effects or reactions to medicines that you should watch for. Contact a health care provider if you: Think you are having a reaction to a medicine you are taking. Have headaches that keep coming back (recurring). Feel dizzy. Have swelling in your ankles. Have trouble with your vision. Get help right away if you: Develop a severe headache or confusion. Have unusual weakness or numbness. Feel faint. Have severe pain in your chest or abdomen. Vomit repeatedly. Have trouble breathing. These symptoms may be an emergency. Get help right away. Call 911. Do not wait to see if the symptoms will go away. Do not drive yourself to the hospital. Summary Hypertension is when the force of blood pumping through your arteries is too strong. If this condition is not controlled, it may put you at risk for serious complications. Your personal target blood pressure may vary depending on your medical conditions, your age, and other factors. For most people, a normal blood pressure is less than 120/80. Hypertension is treated with lifestyle changes, medicines, or a combination of both. Lifestyle changes include losing weight, eating a healthy,  low-sodium diet, exercising more, and limiting alcohol. This information is not intended to replace advice given to you by your health care provider. Make sure you discuss any questions you have with your health care provider. Document Revised: 12/14/2020 Document Reviewed: 12/14/2020 Elsevier Patient Education  2024 [employer].

## 2023-04-30 NOTE — Progress Notes (Addendum)
 Subjective:    Patient ID: Jerry Warren, male    DOB: April 01, 1980, 43 y.o.   MRN: [id number]  Pt presents to the office today for CPE.   He is complaining of right jaw/neck pain. He had an abscess tooth on right lower gum that was pulled 04/25/23. He completed Amoxicillin 500 mg.   Has OSA and uses CPAP nightly.   Hypertension This is a chronic problem. The current episode started more than 1 year ago. The problem has been waxing and waning since onset. The problem is controlled. Associated symptoms include anxiety. Pertinent negatives include no malaise/fatigue, peripheral edema or shortness of breath. Risk factors for coronary artery disease include dyslipidemia, obesity, male gender, sedentary lifestyle and smoking/tobacco exposure. The current treatment provides moderate improvement.  Gastroesophageal Reflux He complains of belching and heartburn. This is a chronic problem. The current episode started more than 1 year ago. The problem occurs occasionally. Risk factors include obesity and smoking/tobacco exposure. He has tried a PPI for the symptoms. The treatment provided moderate relief.  Back Pain This is a chronic problem. The current episode started more than 1 year ago. The problem has been waxing and waning since onset. The pain is present in the lumbar spine. The quality of the pain is described as aching. The pain is at a severity of 6/10. The pain is mild.  Oral Pain  This is a new problem. The current episode started 1 to 4 weeks ago. The pain is at a severity of 8/10. The pain is moderate.  Anxiety Presents for follow-up visit. Symptoms include excessive worry, nervous/anxious behavior and restlessness. Patient reports no shortness of breath.        Review of Systems  Constitutional:  Negative for malaise/fatigue.  Respiratory:  Negative for shortness of breath.   Gastrointestinal:  Positive for heartburn.  Psychiatric/Behavioral:  The patient is nervous/anxious.    All other systems reviewed and are negative.  Family History  Problem Relation Age of Onset   Diabetes Father    Hypertension Father    Social History   Socioeconomic History   Marital status: Married    Spouse name: Not on file   Number of children: Not on file   Years of education: Not on file   Highest education level: Not on file  Occupational History   Not on file  Tobacco Use   Smoking status: Former    Current packs/day: 0.00    Average packs/day: 1 pack/day for 19.0 years (19.0 ttl pk-yrs)    Types: Cigarettes    Start date: 11/04/1994    Quit date: 08/24/2013    Years since quitting: 9.6   Smokeless tobacco: Never  Vaping Use   Vaping status: Never Used  Substance and Sexual Activity   Alcohol use: No    Alcohol/week: 0.0 standard drinks of alcohol   Drug use: No   Sexual activity: Not on file  Other Topics Concern   Not on file  Social History Narrative   Not on file   Social Drivers of Health   Financial Resource Strain: Not on file  Food Insecurity: Not on file  Transportation Needs: Not on file  Physical Activity: Not on file  Stress: Not on file  Social Connections: Unknown (07/04/2021)   Received from [facility], [facility]   Social Network    Social Network: Not on file       Objective:   Physical Exam Vitals reviewed.  Constitutional:  General: He is not in acute distress.    Appearance: He is well-developed. He is obese. He is not ill-appearing.  HENT:     Head: Normocephalic.     Right Ear: Tympanic membrane normal.     Left Ear: Tympanic membrane normal.     Mouth/Throat:      Comments: Swelling, odor Eyes:     General:        Right eye: No discharge.        Left eye: No discharge.     Pupils: Pupils are equal, round, and reactive to light.  Neck:     Thyroid: No thyromegaly.  Cardiovascular:     Rate and Rhythm: Normal rate and regular rhythm.     Heart sounds: Normal heart sounds. No murmur heard. Pulmonary:      Effort: Pulmonary effort is normal. No respiratory distress.     Breath sounds: Normal breath sounds. No wheezing.  Abdominal:     General: Bowel sounds are normal. There is no distension.     Palpations: Abdomen is soft.     Tenderness: There is no abdominal tenderness.  Musculoskeletal:        General: No tenderness. Normal range of motion.     Cervical back: Normal range of motion and neck supple.  Skin:    General: Skin is warm and dry.     Findings: No erythema or rash.  Neurological:     Mental Status: He is alert and oriented to person, place, and time.     Cranial Nerves: No cranial nerve deficit.     Deep Tendon Reflexes: Reflexes are normal and symmetric.  Psychiatric:        Behavior: Behavior normal.        Thought Content: Thought content normal.        Judgment: Judgment normal.          BP (!) 149/79   Pulse 60   Temp (!) 97 F (36.1 C) (Temporal)   Ht 5\' 10"  (1.778 m)   Wt (!) 330 lb 6.4 oz (149.9 kg)   SpO2 96%   BMI 47.41 kg/m   Assessment & Plan:  Jerry Warren comes in today with chief complaint of Neck Pain, Hypertension, and Oral Pain (Tooth was pulled last week and a having pain )   Diagnosis and orders addressed:  1. GAD (generalized anxiety disorder) - escitalopram (LEXAPRO) 10 MG tablet; Take 1 tablet (10 mg total) by mouth daily.  Dispense: 90 tablet; Refill: 2 - busPIRone (BUSPAR) 5 MG tablet; Take 1 tablet (5 mg total) by mouth 3 (three) times daily as needed.  Dispense: 270 tablet; Refill: 2  2. Panic attack - busPIRone (BUSPAR) 5 MG tablet; Take 1 tablet (5 mg total) by mouth 3 (three) times daily as needed.  Dispense: 270 tablet; Refill: 2  3. Annual physical exam (Primary) - CMP14+EGFR - CBC with Differential/Platelet - Lipid panel - PSA, total and free  4. Primary hypertension - losartan (COZAAR) 100 MG tablet; Take 1 tablet (100 mg total) by mouth daily.  Dispense: 90 tablet; Refill: 1 - metoprolol tartrate (LOPRESSOR) 25  MG tablet; Take 1 tablet (25 mg total) by mouth 2 (two) times daily.  Dispense: 180 tablet; Refill: 1 - hydrochlorothiazide (HYDRODIURIL) 25 MG tablet; Take 1 tablet (25 mg total) by mouth daily. **NEEDS TO BE SEEN BEFORE NEXT REFILL**  Dispense: 90 tablet; Refill: 2  5. Gastroesophageal reflux disease with esophagitis, unspecified whether hemorrhage - famotidine (  PEPCID) 20 MG tablet; Take 1 tablet (20 mg total) by mouth in the morning and at bedtime.  Dispense: 90 tablet; Refill: 3  6. Class 3 severe obesity due to excess calories with serious comorbidity and body mass index (BMI) of 45.0 to 49.9 in adult (HCC)  7. Chronic intermittent hypoxia with obstructive sleep apnea   9. Chronic bilateral low back pain without sciatica  - baclofen (LIORESAL) 10 MG tablet; Take 1 tablet (10 mg total) by mouth 3 (three) times daily.  Dispense: 60 tablet; Refill: 4  10. Pain, dental - amoxicillin-clavulanate (AUGMENTIN) 875-125 MG tablet; Take 1 tablet by mouth 2 (two) times daily.  Dispense: 14 tablet; Refill: 0  11. Dental abscess - amoxicillin-clavulanate (AUGMENTIN) 875-125 MG tablet; Take 1 tablet by mouth 2 (two) times daily.  Dispense: 14 tablet; Refill: 0   Labs pending Stop Amoxicillin and start Augmentin for dental infection  Will increase Losartan to 100 mg from 50 mg  -Daily blood pressure log given with instructions on how to fill out and told to bring to next visit -Dash diet information given -Exercise encouraged - Stress Management  -Continue current medications  Health Maintenance reviewed Diet and exercise encouraged  Follow up plan: 2 weeks for HTN and Dental pain  Jannifer Rodney, FNP

## 2023-05-01 LAB — CBC WITH DIFFERENTIAL/PLATELET
Basophils Absolute: 0 10*3/uL (ref 0.0–0.2)
Basos: 0 %
EOS (ABSOLUTE): 0.1 10*3/uL (ref 0.0–0.4)
Eos: 1 %
Hematocrit: 44.1 % (ref 37.5–51.0)
Hemoglobin: 14.7 g/dL (ref 13.0–17.7)
Immature Grans (Abs): 0 10*3/uL (ref 0.0–0.1)
Immature Granulocytes: 0 %
Lymphocytes Absolute: 2.7 10*3/uL (ref 0.7–3.1)
Lymphs: 35 %
MCH: 29.8 pg (ref 26.6–33.0)
MCHC: 33.3 g/dL (ref 31.5–35.7)
MCV: 90 fL (ref 79–97)
Monocytes Absolute: 0.5 10*3/uL (ref 0.1–0.9)
Monocytes: 6 %
Neutrophils Absolute: 4.6 10*3/uL (ref 1.4–7.0)
Neutrophils: 58 %
Platelets: 246 10*3/uL (ref 150–450)
RBC: 4.93 x10E6/uL (ref 4.14–5.80)
RDW: 13.7 % (ref 11.6–15.4)
WBC: 7.9 10*3/uL (ref 3.4–10.8)

## 2023-05-01 LAB — CMP14+EGFR
ALT: 30 IU/L (ref 0–44)
AST: 27 IU/L (ref 0–40)
Albumin: 4.6 g/dL (ref 4.1–5.1)
Alkaline Phosphatase: 102 IU/L (ref 44–121)
BUN/Creatinine Ratio: 12 (ref 9–20)
BUN: 15 mg/dL (ref 6–24)
Bilirubin Total: 0.4 mg/dL (ref 0.0–1.2)
CO2: 27 mmol/L (ref 20–29)
Calcium: 9.3 mg/dL (ref 8.7–10.2)
Chloride: 97 mmol/L (ref 96–106)
Creatinine, Ser: 1.26 mg/dL (ref 0.76–1.27)
Globulin, Total: 3.1 g/dL (ref 1.5–4.5)
Glucose: 189 mg/dL — ABNORMAL HIGH (ref 70–99)
Potassium: 4.1 mmol/L (ref 3.5–5.2)
Sodium: 140 mmol/L (ref 134–144)
Total Protein: 7.7 g/dL (ref 6.0–8.5)
eGFR: 72 mL/min/{1.73_m2} (ref >59)

## 2023-05-01 LAB — PSA, TOTAL AND FREE
PSA, Free Pct: 40.8 %
PSA, Free: 0.53 ng/mL
Prostate Specific Ag, Serum: 1.3 ng/mL (ref 0.0–4.0)

## 2023-05-01 LAB — LIPID PANEL
Chol/HDL Ratio: 6.5 ratio — ABNORMAL HIGH (ref 0.0–5.0)
Cholesterol, Total: 208 mg/dL — ABNORMAL HIGH (ref 100–199)
HDL: 32 mg/dL — ABNORMAL LOW (ref >39)
LDL Chol Calc (NIH): 137 mg/dL — ABNORMAL HIGH (ref 0–99)
Triglycerides: 218 mg/dL — ABNORMAL HIGH (ref 0–149)
VLDL Cholesterol Cal: 39 mg/dL (ref 5–40)

## 2023-05-11 DIAGNOSIS — G4733 Obstructive sleep apnea (adult) (pediatric): Secondary | ICD-10-CM | POA: Diagnosis not present

## 2023-05-18 ENCOUNTER — Ambulatory Visit (INDEPENDENT_AMBULATORY_CARE_PROVIDER_SITE_OTHER): Admitting: Family

## 2023-05-18 ENCOUNTER — Ambulatory Visit: Admitting: Family

## 2023-05-18 ENCOUNTER — Encounter: Payer: Self-pay | Admitting: Family

## 2023-05-18 VITALS — BP 136/74 | HR 89 | Temp 97.6°F | Ht 70.0 in | Wt 329.0 lb

## 2023-05-18 DIAGNOSIS — R7309 Other abnormal glucose: Secondary | ICD-10-CM | POA: Diagnosis not present

## 2023-05-18 DIAGNOSIS — I10 Essential (primary) hypertension: Secondary | ICD-10-CM | POA: Diagnosis not present

## 2023-05-18 LAB — BAYER DCA HB A1C WAIVED: HB A1C (BAYER DCA - WAIVED): 7.2 % — ABNORMAL HIGH (ref 4.8–5.6)

## 2023-05-18 NOTE — Progress Notes (Signed)
 Subjective:    Patient ID: Jerry Warren, male    DOB: Dec 09, 1980, 43 y.o.   MRN: [id number]  Chief Complaint  Patient presents with   Hypertension   PT presents to the office today to recheck HTN. He was seen on 04/30/23 and his losartan was increased to 100 mg from 50 mg. His BP is at goal today.   His glucose was elevated. Will add A1C today.  Hypertension This is a chronic problem. The current episode started more than 1 year ago. The problem has been resolved since onset. The problem is controlled. Pertinent negatives include no malaise/fatigue, peripheral edema or shortness of breath. Past treatments include angiotensin blockers. The current treatment provides moderate improvement.      Review of Systems  Constitutional:  Negative for malaise/fatigue.  Respiratory:  Negative for shortness of breath.   All other systems reviewed and are negative.   Social History   Socioeconomic History   Marital status: Married    Spouse name: Not on file   Number of children: Not on file   Years of education: Not on file   Highest education level: Not on file  Occupational History   Not on file  Tobacco Use   Smoking status: Former    Current packs/day: 0.00    Average packs/day: 1 pack/day for 19.0 years (19.0 ttl pk-yrs)    Types: Cigarettes    Start date: 11/04/1994    Quit date: 08/24/2013    Years since quitting: 9.7   Smokeless tobacco: Never  Vaping Use   Vaping status: Never Used  Substance and Sexual Activity   Alcohol use: No    Alcohol/week: 0.0 standard drinks of alcohol   Drug use: No   Sexual activity: Not on file  Other Topics Concern   Not on file  Social History Narrative   Not on file   Social Drivers of Health   Financial Resource Strain: Not on file  Food Insecurity: Not on file  Transportation Needs: Not on file  Physical Activity: Not on file  Stress: Not on file  Social Connections: Unknown (07/04/2021)   Received from [facility], [facility]   Social Network    Social Network: Not on file   Family History  Problem Relation Age of Onset   Diabetes Father    Hypertension Father         Objective:   Physical Exam Vitals reviewed.  Constitutional:      General: He is not in acute distress.    Appearance: He is well-developed. He is obese.  HENT:     Head: Normocephalic.     Right Ear: Tympanic membrane normal.     Left Ear: Tympanic membrane normal.  Eyes:     General:        Right eye: No discharge.        Left eye: No discharge.     Pupils: Pupils are equal, round, and reactive to light.  Neck:     Thyroid: No thyromegaly.  Cardiovascular:     Rate and Rhythm: Normal rate and regular rhythm.     Heart sounds: Normal heart sounds. No murmur heard. Pulmonary:     Effort: Pulmonary effort is normal. No respiratory distress.     Breath sounds: Normal breath sounds. No wheezing.  Abdominal:     General: Bowel sounds are normal. There is no distension.     Palpations: Abdomen is soft.     Tenderness: There is  no abdominal tenderness.  Musculoskeletal:        General: No tenderness. Normal range of motion.     Cervical back: Normal range of motion and neck supple.  Skin:    General: Skin is warm and dry.     Findings: No erythema or rash.  Neurological:     Mental Status: He is alert and oriented to person, place, and time.     Cranial Nerves: No cranial nerve deficit.     Deep Tendon Reflexes: Reflexes are normal and symmetric.  Psychiatric:        Behavior: Behavior normal.        Thought Content: Thought content normal.        Judgment: Judgment normal.       BP 136/74   Pulse 89   Temp 97.6 F (36.4 C) (Temporal)   Ht 5\' 10"  (1.778 m)   Wt (!) 329 lb (149.2 kg)   BMI 47.21 kg/m      Assessment & Plan:  Jerry Warren comes in today with chief complaint of Hypertension   Diagnosis and orders addressed:  1. Primary hypertension (Primary) At goal -Dash diet information  given -Exercise encouraged - Stress Management  -Continue current meds - BMP8+EGFR  2. Elevated glucose Labs pending  Low carb diet  - BMP8+EGFR - Bayer DCA Hb A1c Waived   Labs pending Continue current medications  Keep follow up with specialists  Health Maintenance reviewed Diet and exercise encouraged  No follow-ups on file.    Jannifer Rodney, FNP

## 2023-05-18 NOTE — Patient Instructions (Signed)
 Hypertension, Adult High blood pressure (hypertension) is when the force of blood pumping through the arteries is too strong. The arteries are the blood vessels that carry blood from the heart throughout the body. Hypertension forces the heart to work harder to pump blood and may cause arteries to become narrow or stiff. Untreated or uncontrolled hypertension can lead to a heart attack, heart failure, a stroke, kidney disease, and other problems. A blood pressure reading consists of a higher number over a lower number. Ideally, your blood pressure should be below 120/80. The first ("top") number is called the systolic pressure. It is a measure of the pressure in your arteries as your heart beats. The second ("bottom") number is called the diastolic pressure. It is a measure of the pressure in your arteries as the heart relaxes. What are the causes? The exact cause of this condition is not known. There are some conditions that result in high blood pressure. What increases the risk? Certain factors may make you more likely to develop high blood pressure. Some of these risk factors are under your control, including: Smoking. Not getting enough exercise or physical activity. Being overweight. Having too much fat, sugar, calories, or salt (sodium) in your diet. Drinking too much alcohol. Other risk factors include: Having a personal history of heart disease, diabetes, high cholesterol, or kidney disease. Stress. Having a family history of high blood pressure and high cholesterol. Having obstructive sleep apnea. Age. The risk increases with age. What are the signs or symptoms? High blood pressure may not cause symptoms. Very high blood pressure (hypertensive crisis) may cause: Headache. Fast or irregular heartbeats (palpitations). Shortness of breath. Nosebleed. Nausea and vomiting. Vision changes. Severe chest pain, dizziness, and seizures. How is this diagnosed? This condition is diagnosed by  measuring your blood pressure while you are seated, with your arm resting on a flat surface, your legs uncrossed, and your feet flat on the floor. The cuff of the blood pressure monitor will be placed directly against the skin of your upper arm at the level of your heart. Blood pressure should be measured at least twice using the same arm. Certain conditions can cause a difference in blood pressure between your right and left arms. If you have a high blood pressure reading during one visit or you have normal blood pressure with other risk factors, you may be asked to: Return on a different day to have your blood pressure checked again. Monitor your blood pressure at home for 1 week or longer. If you are diagnosed with hypertension, you may have other blood or imaging tests to help your health care provider understand your overall risk for other conditions. How is this treated? This condition is treated by making healthy lifestyle changes, such as eating healthy foods, exercising more, and reducing your alcohol intake. You may be referred for counseling on a healthy diet and physical activity. Your health care provider may prescribe medicine if lifestyle changes are not enough to get your blood pressure under control and if: Your systolic blood pressure is above 130. Your diastolic blood pressure is above 80. Your personal target blood pressure may vary depending on your medical conditions, your age, and other factors. Follow these instructions at home: Eating and drinking  Eat a diet that is high in fiber and potassium, and low in sodium, added sugar, and fat. An example of this eating plan is called the DASH diet. DASH stands for Dietary Approaches to Stop Hypertension. To eat this way: Eat  plenty of fresh fruits and vegetables. Try to fill one half of your plate at each meal with fruits and vegetables. Eat whole grains, such as whole-wheat pasta, brown rice, or whole-grain bread. Fill about one  fourth of your plate with whole grains. Eat or drink low-fat dairy products, such as skim milk or low-fat yogurt. Avoid fatty cuts of meat, processed or cured meats, and poultry with skin. Fill about one fourth of your plate with lean proteins, such as fish, chicken without skin, beans, eggs, or tofu. Avoid pre-made and processed foods. These tend to be higher in sodium, added sugar, and fat. Reduce your daily sodium intake. Many people with hypertension should eat less than 1,500 mg of sodium a day. Do not drink alcohol if: Your health care provider tells you not to drink. You are pregnant, may be pregnant, or are planning to become pregnant. If you drink alcohol: Limit how much you have to: 0-1 drink a day for women. 0-2 drinks a day for men. Know how much alcohol is in your drink. In the [place] one drink equals one 12 oz bottle of beer (355 mL), one 5 oz glass of wine (148 mL), or one 1 oz glass of hard liquor (44 mL). Lifestyle  Work with your health care provider to maintain a healthy body weight or to lose weight. Ask what an ideal weight is for you. Get at least 30 minutes of exercise that causes your heart to beat faster (aerobic exercise) most days of the week. Activities may include walking, swimming, or biking. Include exercise to strengthen your muscles (resistance exercise), such as Pilates or lifting weights, as part of your weekly exercise routine. Try to do these types of exercises for 30 minutes at least 3 days a week. Do not use any products that contain nicotine or tobacco. These products include cigarettes, chewing tobacco, and vaping devices, such as e-cigarettes. If you need help quitting, ask your health care provider. Monitor your blood pressure at home as told by your health care provider. Keep all follow-up visits. This is important. Medicines Take over-the-counter and prescription medicines only as told by your health care provider. Follow directions carefully. Blood  pressure medicines must be taken as prescribed. Do not skip doses of blood pressure medicine. Doing this puts you at risk for problems and can make the medicine less effective. Ask your health care provider about side effects or reactions to medicines that you should watch for. Contact a health care provider if you: Think you are having a reaction to a medicine you are taking. Have headaches that keep coming back (recurring). Feel dizzy. Have swelling in your ankles. Have trouble with your vision. Get help right away if you: Develop a severe headache or confusion. Have unusual weakness or numbness. Feel faint. Have severe pain in your chest or abdomen. Vomit repeatedly. Have trouble breathing. These symptoms may be an emergency. Get help right away. Call 911. Do not wait to see if the symptoms will go away. Do not drive yourself to the hospital. Summary Hypertension is when the force of blood pumping through your arteries is too strong. If this condition is not controlled, it may put you at risk for serious complications. Your personal target blood pressure may vary depending on your medical conditions, your age, and other factors. For most people, a normal blood pressure is less than 120/80. Hypertension is treated with lifestyle changes, medicines, or a combination of both. Lifestyle changes include losing weight, eating a healthy,  low-sodium diet, exercising more, and limiting alcohol. This information is not intended to replace advice given to you by your health care provider. Make sure you discuss any questions you have with your health care provider. Document Revised: 12/14/2020 Document Reviewed: 12/14/2020 Elsevier Patient Education  2024 [employer].

## 2023-05-19 LAB — BMP8+EGFR
BUN/Creatinine Ratio: 13 (ref 9–20)
BUN: 18 mg/dL (ref 6–24)
CO2: 27 mmol/L (ref 20–29)
Calcium: 9.3 mg/dL (ref 8.7–10.2)
Chloride: 100 mmol/L (ref 96–106)
Creatinine, Ser: 1.36 mg/dL — ABNORMAL HIGH (ref 0.76–1.27)
Glucose: 138 mg/dL — ABNORMAL HIGH (ref 70–99)
Potassium: 3.6 mmol/L (ref 3.5–5.2)
Sodium: 142 mmol/L (ref 134–144)
eGFR: 66 mL/min/{1.73_m2} (ref >59)

## 2023-05-22 ENCOUNTER — Other Ambulatory Visit: Payer: Self-pay | Admitting: Family

## 2023-05-22 ENCOUNTER — Other Ambulatory Visit: Payer: Self-pay | Admitting: Family Medicine

## 2023-05-22 DIAGNOSIS — E1165 Type 2 diabetes mellitus with hyperglycemia: Secondary | ICD-10-CM

## 2023-05-22 MED ORDER — LANCET DEVICE MISC: 1.0000 | Freq: Three times a day (TID) | 0 refills | Status: AC

## 2023-05-22 MED ORDER — BLOOD GLUCOSE MONITORING SUPPL DEVI
1.0000 | Freq: Three times a day (TID) | 0 refills | Status: AC
Start: 2023-05-22 — End: ?

## 2023-05-22 MED ORDER — LANCETS MISC. MISC: 1.0000 | Freq: Three times a day (TID) | 0 refills | Status: AC

## 2023-05-22 MED ORDER — METFORMIN HCL 500 MG PO TABS: 500.0000 mg | ORAL_TABLET | Freq: Two times a day (BID) | ORAL | 2 refills | Status: DC

## 2023-05-22 MED ORDER — BLOOD GLUCOSE TEST VI STRP: 1.0000 | ORAL_STRIP | Freq: Three times a day (TID) | 0 refills | Status: AC

## 2023-05-28 ENCOUNTER — Telehealth: Payer: Self-pay

## 2023-05-28 NOTE — Progress Notes (Signed)
 Care Guide Pharmacy Note  05/28/2023 Name: Jerry Warren MRN: [id number] DOB: [birth year]  Referred By: Junie Spencer, FNP Reason for referral: Complex Care Management (Outreach to schedule with Pharm d )   Jerry Warren is a 43 y.o. year old male who is a primary care patient of Junie Spencer, FNP.  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  Patient declines engagement at this time. Contact information was provided to the patient should they wish to reach out for assistance at a later time.  Penne Lash , RMA     [facility]  [facility], [facility] Guide  Direct Dial: [phone]  Website: [email or web address]

## 2023-06-19 ENCOUNTER — Other Ambulatory Visit: Payer: Self-pay | Admitting: Family

## 2023-06-19 MED ORDER — LUTEIN 20 MG PO CAPS: 20.0000 mg | ORAL_CAPSULE | Freq: Every day | ORAL | 1 refills | Status: AC

## 2023-07-12 DIAGNOSIS — G4733 Obstructive sleep apnea (adult) (pediatric): Secondary | ICD-10-CM | POA: Diagnosis not present

## 2023-08-08 DIAGNOSIS — G4733 Obstructive sleep apnea (adult) (pediatric): Secondary | ICD-10-CM | POA: Diagnosis not present

## 2023-08-09 ENCOUNTER — Telehealth: Payer: Self-pay | Admitting: Family Medicine

## 2023-08-09 NOTE — Telephone Encounter (Signed)
 Pt wife called to cancel appt  Appt Cancel

## 2023-08-23 ENCOUNTER — Ambulatory Visit: Admitting: Family

## 2023-08-28 ENCOUNTER — Other Ambulatory Visit: Payer: Self-pay | Admitting: Family

## 2023-08-28 DIAGNOSIS — I10 Essential (primary) hypertension: Secondary | ICD-10-CM

## 2023-09-20 ENCOUNTER — Ambulatory Visit: Payer: 59 | Admitting: Family Medicine

## 2023-09-26 ENCOUNTER — Emergency Department (HOSPITAL_COMMUNITY)
Admission: EM | Admit: 2023-09-26 | Discharge: 2023-09-26 | Disposition: A | Discharge status: Home / Self Care | Attending: Emergency Medicine | Admitting: Emergency Medicine

## 2023-09-26 ENCOUNTER — Emergency Department (HOSPITAL_COMMUNITY)

## 2023-09-26 ENCOUNTER — Other Ambulatory Visit: Payer: Self-pay

## 2023-09-26 ENCOUNTER — Encounter (HOSPITAL_COMMUNITY): Payer: Self-pay | Admitting: Emergency Medicine

## 2023-09-26 DIAGNOSIS — I10 Essential (primary) hypertension: Secondary | ICD-10-CM | POA: Insufficient documentation

## 2023-09-26 DIAGNOSIS — M79661 Pain in right lower leg: Secondary | ICD-10-CM

## 2023-09-26 DIAGNOSIS — Z79899 Other long term (current) drug therapy: Secondary | ICD-10-CM | POA: Diagnosis not present

## 2023-09-26 DIAGNOSIS — M79605 Pain in left leg: Secondary | ICD-10-CM | POA: Diagnosis not present

## 2023-09-26 DIAGNOSIS — Z7982 Long term (current) use of aspirin: Secondary | ICD-10-CM | POA: Diagnosis not present

## 2023-09-26 DIAGNOSIS — M79604 Pain in right leg: Secondary | ICD-10-CM | POA: Insufficient documentation

## 2023-09-26 DIAGNOSIS — R7989 Other specified abnormal findings of blood chemistry: Secondary | ICD-10-CM | POA: Insufficient documentation

## 2023-09-26 DIAGNOSIS — R0789 Other chest pain: Secondary | ICD-10-CM | POA: Diagnosis not present

## 2023-09-26 DIAGNOSIS — R079 Chest pain, unspecified: Secondary | ICD-10-CM | POA: Diagnosis not present

## 2023-09-26 LAB — CBC
HCT: 44.2 % (ref 39.0–52.0)
Hemoglobin: 14.2 g/dL (ref 13.0–17.0)
MCH: 28.4 pg (ref 26.0–34.0)
MCHC: 32.1 g/dL (ref 30.0–36.0)
MCV: 88.4 fL (ref 80.0–100.0)
Platelets: 242 K/uL (ref 150–400)
RBC: 5 MIL/uL (ref 4.22–5.81)
RDW: 14.8 % (ref 11.5–15.5)
WBC: 10.1 K/uL (ref 4.0–10.5)
nRBC: 0 % (ref 0.0–0.2)

## 2023-09-26 LAB — BASIC METABOLIC PANEL WITH GFR
Anion gap: 10 (ref 5–15)
BUN: 11 mg/dL (ref 6–20)
CO2: 27 mmol/L (ref 22–32)
Calcium: 9.3 mg/dL (ref 8.9–10.3)
Chloride: 102 mmol/L (ref 98–111)
Creatinine, Ser: 1.41 mg/dL — ABNORMAL HIGH (ref 0.61–1.24)
GFR, Estimated: >60 mL/min
Glucose, Bld: 133 mg/dL — ABNORMAL HIGH (ref 70–99)
Potassium: 3.7 mmol/L (ref 3.5–5.1)
Sodium: 139 mmol/L (ref 135–145)

## 2023-09-26 LAB — TROPONIN I (HIGH SENSITIVITY)
Troponin I (High Sensitivity): 10 ng/L
Troponin I (High Sensitivity): 10 ng/L (ref <18)

## 2023-09-26 NOTE — Progress Notes (Signed)
 Venous duplex lower ext  has been completed. Refer to [facility] under chart review to view preliminary results.   09/26/2023  4:56 PM Taj Nevins, Ricka BIRCH

## 2023-09-26 NOTE — ED Triage Notes (Signed)
 Pt endorses pain to arms and legs a week ago. Pain started on right leg, behind knee. States the veins in his chest hurt. States it feels like his breath is taken away when he stands up after sitting for awhile.

## 2023-09-26 NOTE — ED Notes (Signed)
 Pt leaving for home. Paper work reviewed with pt. Pt is in no new onset distress at this time.

## 2023-09-26 NOTE — Discharge Instructions (Addendum)
 As we discussed, I would like for you to follow-up with your primary care doctor.  You can keep your appointment for later this month.  I think that is totally appropriate.  I would consider wearing some compression stockings which should help with some of the vein problems that you are having.  This could be the beginning of varicose veins like we discussed.  As far as the other migratory pains you are having in your joints you could just take ibuprofen this could be arthritis.  You can return to the emergency department for any worsening symptoms.

## 2023-09-26 NOTE — ED Provider Notes (Signed)
  EMERGENCY DEPARTMENT AT [facility] Provider Note   CSN: [id number] Arrival date & time: 09/26/23  1402     Patient presents with: Leg Pain and Chest Pain   Jerry Warren is a 43 y.o. male patient with history of hypertension who presents to the emergency department today for further evaluation of vein pain.  Symptoms started in his right lower extremity while he was massaging at the back of his calf approximately 2 weeks ago.  He felt a sharp burning sensation that has since migrated up the right leg.  He states it is painful to walk but that is currently improving.  He also states that he gets these pains in the veins across his body.  They are intermittent and change positions.  He denies any chest pain, shortness of breath, fever, chills.  He denies any long recent car trips or airplane travel, hormone replacement therapy, cancer.    Leg Pain Chest Pain      Prior to Admission medications   Medication Sig Start Date End Date Taking? Authorizing Provider  aspirin 81 MG chewable tablet Chew 81 mg by mouth daily.    [provider]  baclofen  (LIORESAL ) 10 MG tablet Take 1 tablet (10 mg total) by mouth 3 (three) times daily. 04/30/23   Lavell Lye A, FNP  Blood Glucose Monitoring Suppl DEVI 1 each by Does not apply route in the morning, at noon, and at bedtime. May substitute to any manufacturer covered by patient's insurance. 05/22/23   Lavell Lye A, FNP  busPIRone  (BUSPAR ) 5 MG tablet Take 1 tablet (5 mg total) by mouth 3 (three) times daily as needed. 04/30/23   Lavell Lye LABOR, FNP  escitalopram  (LEXAPRO ) 10 MG tablet Take 1 tablet (10 mg total) by mouth daily. Patient not taking: Reported on 05/18/2023 04/30/23   Lavell Lye A, FNP  famotidine  (PEPCID ) 20 MG tablet Take 1 tablet (20 mg total) by mouth in the morning and at bedtime. 04/30/23   Lavell Lye A, FNP  hydrochlorothiazide  (HYDRODIURIL ) 25 MG tablet Take 1 tablet (25 mg total) by  mouth daily. **NEEDS TO BE SEEN BEFORE NEXT REFILL** 04/30/23   Lavell Lye A, FNP  losartan  (COZAAR ) 100 MG tablet Take 1 tablet by mouth once daily 08/28/23   Lavell Lye A, FNP  Lutein  20 MG CAPS Take 1 capsule (20 mg total) by mouth daily. 06/19/23   Lavell Lye LABOR, FNP  metFORMIN  (GLUCOPHAGE ) 500 MG tablet Take 1 tablet (500 mg total) by mouth 2 (two) times daily with a meal. 05/22/23   Hawks, Lye A, FNP  metoprolol  tartrate (LOPRESSOR ) 25 MG tablet Take 1 tablet (25 mg total) by mouth 2 (two) times daily. 04/30/23   Lavell Lye LABOR, FNP    Allergies: Patient has no known allergies.    Review of Systems  Cardiovascular:  Positive for chest pain.  All other systems reviewed and are negative.   Updated Vital Signs BP (!) 162/79   Pulse (!) 44   Temp 98.6 F (37 C) (Oral)   Resp 12   Ht 5' 10.5 (1.791 m)   Wt (!) 150 kg   SpO2 100%   BMI 46.78 kg/m   Physical Exam Vitals and nursing note reviewed.  Constitutional:      General: He is not in acute distress.    Appearance: Normal appearance.  HENT:     Head: Normocephalic and atraumatic.  Eyes:     General:  Right eye: No discharge.        Left eye: No discharge.  Cardiovascular:     Comments: Regular rate and rhythm.  S1/S2 are distinct without any evidence of murmur, rubs, or gallops.  Radial pulses are 2+ bilaterally.  Dorsalis pedis pulses are 2+ bilaterally.  No evidence of pedal edema. Pulmonary:     Comments: Clear to auscultation bilaterally.  Normal effort.  No respiratory distress.  No evidence of wheezes, rales, or rhonchi heard throughout. Abdominal:     General: Abdomen is flat. Bowel sounds are normal. There is no distension.     Tenderness: There is no abdominal tenderness. There is no guarding or rebound.  Musculoskeletal:        General: Normal range of motion.     Cervical back: Neck supple.  Skin:    General: Skin is warm and dry.     Findings: No rash.  Neurological:     General: No  focal deficit present.     Mental Status: He is alert.  Psychiatric:        Mood and Affect: Mood normal.        Behavior: Behavior normal.     (all labs ordered are listed, but only abnormal results are displayed) Labs Reviewed  BASIC METABOLIC PANEL WITH GFR - Abnormal; Notable for the following components:      Result Value   Glucose, Bld 133 (*)    Creatinine, Ser 1.41 (*)    All other components within normal limits  CBC  TROPONIN I (HIGH SENSITIVITY)  TROPONIN I (HIGH SENSITIVITY)    EKG: EKG Interpretation Date/Time:  Wednesday September 26 2023 14:12:49 EDT Ventricular Rate:  51 PR Interval:  160 QRS Duration:  94 QT Interval:  430 QTC Calculation: 396 R Axis:   0  Text Interpretation: Sinus bradycardia Otherwise normal ECG When compared with ECG of 19-Aug-2019 17:37, PREVIOUS ECG IS PRESENT Confirmed by Elnor Savant ([id number]) on 09/26/2023 4:51:25 PM  Radiology: VAS US  LOWER EXTREMITY VENOUS (DVT) (ONLY [facility] & [facility]) Result Date: 09/26/2023  Lower Venous DVT Study Patient Name:  Jerry Warren  Date of Exam:   09/26/2023 Medical Rec #: [id number]        Accession #:    [id number] Date of Birth: 1980-10-05        Patient Gender: M Patient Age:   65 years Exam Location:  [facility] Procedure:      VAS US  LOWER EXTREMITY VENOUS (DVT) Referring Phys: CAMERON SERVANT --------------------------------------------------------------------------------  Indications: Pain in both legs, and SOB.  Comparison Study: No priors. Performing Technologist: Ricka Sturdivant-Jones RDMS, RVT  Examination Guidelines: A complete evaluation includes B-mode imaging, spectral Doppler, color Doppler, and power Doppler as needed of all accessible portions of each vessel. Bilateral testing is considered an integral part of a complete examination. Limited examinations for reoccurring indications may be performed as noted. The reflux portion of the exam is performed with the patient in reverse Trendelenburg.   +---------+---------------+---------+-----------+----------+--------------+ RIGHT    CompressibilityPhasicitySpontaneityPropertiesThrombus Aging +---------+---------------+---------+-----------+----------+--------------+ CFV      Full           Yes      Yes                                 +---------+---------------+---------+-----------+----------+--------------+ SFJ      Full                                                        +---------+---------------+---------+-----------+----------+--------------+  FV Prox  Full                                                        +---------+---------------+---------+-----------+----------+--------------+ FV Mid   Full                                                        +---------+---------------+---------+-----------+----------+--------------+ FV DistalFull                                                        +---------+---------------+---------+-----------+----------+--------------+ PFV      Full                                                        +---------+---------------+---------+-----------+----------+--------------+ POP      Full           Yes      Yes                                 +---------+---------------+---------+-----------+----------+--------------+ PTV      Full                                                        +---------+---------------+---------+-----------+----------+--------------+ PERO     Full                                                        +---------+---------------+---------+-----------+----------+--------------+   +---------+---------------+---------+-----------+----------+--------------+ LEFT     CompressibilityPhasicitySpontaneityPropertiesThrombus Aging +---------+---------------+---------+-----------+----------+--------------+ CFV      Full           Yes      Yes                                  +---------+---------------+---------+-----------+----------+--------------+ SFJ      Full                                                        +---------+---------------+---------+-----------+----------+--------------+ FV Prox  Full                                                        +---------+---------------+---------+-----------+----------+--------------+  FV Mid   Full                                                        +---------+---------------+---------+-----------+----------+--------------+ FV DistalFull                                                        +---------+---------------+---------+-----------+----------+--------------+ PFV      Full                                                        +---------+---------------+---------+-----------+----------+--------------+ POP      Full           Yes      Yes                                 +---------+---------------+---------+-----------+----------+--------------+ PTV      Full                                                        +---------+---------------+---------+-----------+----------+--------------+ PERO     Full                                                        +---------+---------------+---------+-----------+----------+--------------+     Summary: BILATERAL: - No evidence of deep vein thrombosis seen in the lower extremities, bilaterally. -No evidence of popliteal cyst, bilaterally.   *See table(s) above for measurements and observations. Electronically signed by Gaile New MD on 09/26/2023 at 6:12:00 PM.    Final    DG Chest 2 View Result Date: 09/26/2023 CLINICAL DATA:  Chest pain. EXAM: CHEST - 2 VIEW COMPARISON:  August 19, 2019. FINDINGS: The heart size and mediastinal contours are within normal limits. Both lungs are clear. The visualized skeletal structures are unremarkable. IMPRESSION: No active cardiopulmonary disease. Electronically Signed   By: Lynwood Landy Raddle M.D.    On: 09/26/2023 15:14     Procedures   Medications Ordered in the ED - No data to display  Clinical Course as of 09/26/23 1841  Wed Sep 26, 2023  8162 Patient is asymptomatic.  I went over all labs and imaging with him and his significant other at the bedside.  I suspect that the vein trouble that he is having in his legs could be the beginning of varicose veins.  The one on his left lower extremity does appear to be more blue-tinged and starting to bulge out of the skin.  We discussed the etiology of varicose veins and overall risk factors.  Patient states that his brother does suffer significantly from varicose veins.  We  also discussed having him wear compression stockings while he is working which can help with overall blood flow in the legs.  Is unclear what is causing the vein pain but I am less concerned for any life-threatening etiologies at this time given the labs and imaging. [CF]  1838 Troponin I (High Sensitivity) Initial and delta troponin are negative. [CF]  1838 CBC Negative. [CF]  1839 Basic metabolic panel(!) Creatinine at baseline for patient.  No other significant abnormalities. [CF]  1839 VAS US  LOWER EXTREMITY VENOUS (DVT) (ONLY [facility] & [facility]) Negative for DVT.  I do agree with the radiologist interpretation. [CF]  1839 DG Chest 2 View No signs of cardiomegaly, pneumothorax, pneumonia.  I do agree with the radiologist interpretation. [CF]    Clinical Course User Index [CF] Theotis Cameron HERO, PA-C    Medical Decision Making Jerry Warren is a 43 y.o. male patient who presents to the emergency department today for further evaluation of bilateral lower extremity pain and migratory vein pain.  DVT studies were negative.  The veins on the left lower extremity do appear to be possibly developing varicosities.  We discussed the etiologies hide in the ED course.  As far as the other migratory pain I am on sure of the exact cause today but I am less concern for any  life-threatening etiologies at this time.  Troponins were negative x 2.  Low suspicion for ACS.  Patient is completely asymptomatic at this time.  Will plan to discharge home with primary care follow-up.  Strict return precautions were discussed.  He is safer discharge at this time.   Amount and/or Complexity of Data Reviewed Labs: ordered. Decision-making details documented in ED Course. Radiology: ordered. Decision-making details documented in ED Course.     Final diagnoses:  Pain in both lower extremities    ED Discharge Orders     None          Theotis Cameron HERO, [place] 09/26/23 1841    Elnor Jayson LABOR, DO 10/01/23 1550

## 2023-10-05 ENCOUNTER — Other Ambulatory Visit: Payer: Self-pay | Admitting: Family

## 2023-10-05 DIAGNOSIS — K2100 Gastro-esophageal reflux disease with esophagitis, without bleeding: Secondary | ICD-10-CM

## 2023-10-12 DIAGNOSIS — G4733 Obstructive sleep apnea (adult) (pediatric): Secondary | ICD-10-CM | POA: Diagnosis not present

## 2023-10-19 ENCOUNTER — Encounter: Payer: Self-pay | Admitting: Family

## 2023-10-19 ENCOUNTER — Ambulatory Visit: Admitting: Family

## 2023-10-19 VITALS — BP 128/76 | HR 85 | Temp 97.5°F | Ht 70.5 in | Wt 318.6 lb

## 2023-10-19 DIAGNOSIS — G4733 Obstructive sleep apnea (adult) (pediatric): Secondary | ICD-10-CM

## 2023-10-19 DIAGNOSIS — K2100 Gastro-esophageal reflux disease with esophagitis, without bleeding: Secondary | ICD-10-CM

## 2023-10-19 DIAGNOSIS — I10 Essential (primary) hypertension: Secondary | ICD-10-CM

## 2023-10-19 DIAGNOSIS — E66813 Obesity, class 3: Secondary | ICD-10-CM

## 2023-10-19 DIAGNOSIS — E1165 Type 2 diabetes mellitus with hyperglycemia: Secondary | ICD-10-CM

## 2023-10-19 DIAGNOSIS — Z6841 Body Mass Index (BMI) 40.0 and over, adult: Secondary | ICD-10-CM | POA: Diagnosis not present

## 2023-10-19 DIAGNOSIS — F411 Generalized anxiety disorder: Secondary | ICD-10-CM

## 2023-10-19 DIAGNOSIS — M545 Low back pain, unspecified: Secondary | ICD-10-CM

## 2023-10-19 DIAGNOSIS — G4734 Idiopathic sleep related nonobstructive alveolar hypoventilation: Secondary | ICD-10-CM | POA: Diagnosis not present

## 2023-10-19 DIAGNOSIS — G8929 Other chronic pain: Secondary | ICD-10-CM | POA: Diagnosis not present

## 2023-10-19 LAB — BAYER DCA HB A1C WAIVED: HB A1C (BAYER DCA - WAIVED): 6.9 % — ABNORMAL HIGH (ref 4.8–5.6)

## 2023-10-19 MED ORDER — OZEMPIC (0.25 OR 0.5 MG/DOSE) 2 MG/3ML ~~LOC~~ SOPN: 0.5000 mg | PEN_INJECTOR | SUBCUTANEOUS | 2 refills | Status: DC

## 2023-10-19 MED ORDER — SEMAGLUTIDE(0.25 OR 0.5MG/DOS) 2 MG/3ML ~~LOC~~ SOPN: 0.2500 mg | PEN_INJECTOR | SUBCUTANEOUS | 0 refills | Status: DC

## 2023-10-19 NOTE — Progress Notes (Signed)
 Subjective:    Patient ID: Jerry Warren, male    DOB: 06-Jul-1980, 43 y.o.   MRN: [id number]  Chief Complaint  Patient presents with   Medical Management of Chronic Issues    Pt presents to the office today for chronic follow up.   Has OSA and uses CPAP nightly.    He was diagnosed as a diabetic on our last visit. He has been dieting and has lost 12 lbs.  Hypertension This is a chronic problem. The current episode started more than 1 year ago. The problem has been resolved since onset. The problem is controlled. Associated symptoms include anxiety and peripheral edema (some times). Pertinent negatives include no blurred vision, malaise/fatigue or shortness of breath. Risk factors for coronary artery disease include dyslipidemia, obesity, male gender, sedentary lifestyle and smoking/tobacco exposure. The current treatment provides moderate improvement.  Gastroesophageal Reflux He complains of belching and heartburn. This is a chronic problem. The current episode started more than 1 year ago. The problem occurs occasionally. The symptoms are aggravated by certain foods. Risk factors include obesity and smoking/tobacco exposure. He has tried a PPI for the symptoms. The treatment provided moderate relief.  Back Pain This is a chronic problem. The current episode started more than 1 year ago. The problem has been waxing and waning since onset. The pain is present in the lumbar spine. The quality of the pain is described as aching. The pain is at a severity of 4/10. The pain is mild. Risk factors include obesity. The treatment provided moderate relief.  Anxiety Presents for follow-up visit. Symptoms include excessive worry, nervous/anxious behavior and restlessness. Patient reports no shortness of breath. Symptoms occur occasionally. The severity of symptoms is mild.    Diabetes He presents for his follow-up diabetic visit. He has type 2 diabetes mellitus. Hypoglycemia symptoms include  nervousness/anxiousness. Pertinent negatives for diabetes include no blurred vision, no foot paresthesias and no polydipsia. Risk factors for coronary artery disease include diabetes mellitus, dyslipidemia, hypertension, male sex, sedentary lifestyle and obesity. He is following a generally healthy diet. (Does not check glucose at home)      Review of Systems  Constitutional:  Negative for malaise/fatigue.  Eyes:  Negative for blurred vision.  Respiratory:  Negative for shortness of breath.   Gastrointestinal:  Positive for heartburn.  Endocrine: Negative for polydipsia.  Psychiatric/Behavioral:  The patient is nervous/anxious.   All other systems reviewed and are negative.  Family History  Problem Relation Age of Onset   Diabetes Father    Hypertension Father    Social History   Socioeconomic History   Marital status: Married    Spouse name: Not on file   Number of children: Not on file   Years of education: Not on file   Highest education level: Not on file  Occupational History   Not on file  Tobacco Use   Smoking status: Former    Current packs/day: 0.00    Average packs/day: 1 pack/day for 19.0 years (19.0 ttl pk-yrs)    Types: Cigarettes    Start date: 11/04/1994    Quit date: 08/24/2013    Years since quitting: 10.1   Smokeless tobacco: Never  Vaping Use   Vaping status: Never Used  Substance and Sexual Activity   Alcohol use: No    Alcohol/week: 0.0 standard drinks of alcohol   Drug use: No   Sexual activity: Not on file  Other Topics Concern   Not on file  Social History Narrative  Not on file   Social Drivers of Health   Financial Resource Strain: Not on file  Food Insecurity: Not on file  Transportation Needs: Not on file  Physical Activity: Not on file  Stress: Not on file  Social Connections: Unknown (07/04/2021)   Received from [facility]   Social Network    Social Network: Not on file       Objective:   Physical Exam Vitals reviewed.   Constitutional:      General: He is not in acute distress.    Appearance: He is well-developed. He is obese. He is not ill-appearing.  HENT:     Head: Normocephalic.     Right Ear: Tympanic membrane normal.     Left Ear: Tympanic membrane normal.  Eyes:     General:        Right eye: No discharge.        Left eye: No discharge.     Pupils: Pupils are equal, round, and reactive to light.  Neck:     Thyroid : No thyromegaly.  Cardiovascular:     Rate and Rhythm: Normal rate and regular rhythm.     Heart sounds: Normal heart sounds. No murmur heard. Pulmonary:     Effort: Pulmonary effort is normal. No respiratory distress.     Breath sounds: Normal breath sounds. No wheezing.  Abdominal:     General: Bowel sounds are normal. There is no distension.     Palpations: Abdomen is soft.     Tenderness: There is no abdominal tenderness.  Musculoskeletal:        General: No tenderness. Normal range of motion.     Cervical back: Normal range of motion and neck supple.  Skin:    General: Skin is warm and dry.     Findings: No erythema or rash.  Neurological:     Mental Status: He is alert and oriented to person, place, and time.     Cranial Nerves: No cranial nerve deficit.     Deep Tendon Reflexes: Reflexes are normal and symmetric.  Psychiatric:        Behavior: Behavior normal.        Thought Content: Thought content normal.        Judgment: Judgment normal.          BP 128/76   Pulse 85   Temp (!) 97.5 F (36.4 C) (Temporal)   Ht 5' 10.5 (1.791 m)   Wt (!) 318 lb 9.6 oz (144.5 kg)   BMI 45.07 kg/m   Assessment & Plan:  VILIAMI BRACCO comes in today with chief complaint of Medical Management of Chronic Issues   Diagnosis and orders addressed:  1. Primary hypertension - CMP14+EGFR  2. Class 3 severe obesity due to excess calories with serious comorbidity and body mass index (BMI) of 45.0 to 49.9 in adult - CMP14+EGFR  3. GAD (generalized anxiety disorder) -  CMP14+EGFR  4. Chronic bilateral low back pain without sciatica - CMP14+EGFR  5. Chronic intermittent hypoxia with obstructive sleep apnea  - CMP14+EGFR  6. Type 2 diabetes mellitus with hyperglycemia, without long-term current use of insulin (HCC) (Primary) - Semaglutide ,0.25 or 0.5MG /DOS, 2 MG/3ML SOPN; Inject 0.25 mg into the skin once a week.  Dispense: 3 mL; Refill: 0 - Semaglutide ,0.25 or 0.5MG /DOS, (OZEMPIC , 0.25 OR 0.5 MG/DOSE,) 2 MG/3ML SOPN; Inject 0.5 mg into the skin once a week.  Dispense: 3 mL; Refill: 2 - Bayer DCA Hb A1c Waived - CMP14+EGFR  7.  Gastroesophageal reflux disease with esophagitis, unspecified whether hemorrhage - CMP14+EGFR    Labs pending Will start Ozempic  0.25 mg for one month then increase to 0.5 mg  Strict low carb diet  -Continue current medications  Health Maintenance reviewed Diet and exercise encouraged  Follow up plan: 3 months for DM  Bari Learn, FNP

## 2023-10-19 NOTE — Patient Instructions (Signed)

## 2023-10-20 LAB — CMP14+EGFR
ALT: 20 IU/L (ref 0–44)
AST: 24 IU/L (ref 0–40)
Albumin: 4.7 g/dL (ref 4.1–5.1)
Alkaline Phosphatase: 104 IU/L (ref 44–121)
BUN/Creatinine Ratio: 8 — ABNORMAL LOW (ref 9–20)
BUN: 13 mg/dL (ref 6–24)
Bilirubin Total: 0.6 mg/dL (ref 0.0–1.2)
CO2: 25 mmol/L (ref 20–29)
Calcium: 9.3 mg/dL (ref 8.7–10.2)
Chloride: 99 mmol/L (ref 96–106)
Creatinine, Ser: 1.57 mg/dL — ABNORMAL HIGH (ref 0.76–1.27)
Globulin, Total: 2.7 g/dL (ref 1.5–4.5)
Glucose: 112 mg/dL — ABNORMAL HIGH (ref 70–99)
Potassium: 3.9 mmol/L (ref 3.5–5.2)
Sodium: 143 mmol/L (ref 134–144)
Total Protein: 7.4 g/dL (ref 6.0–8.5)
eGFR: 55 mL/min/1.73 — ABNORMAL LOW (ref >59)

## 2023-10-23 ENCOUNTER — Telehealth: Payer: Self-pay | Admitting: *Deleted

## 2023-10-23 ENCOUNTER — Other Ambulatory Visit (HOSPITAL_COMMUNITY): Payer: Self-pay

## 2023-10-23 ENCOUNTER — Ambulatory Visit: Payer: Self-pay | Admitting: Family

## 2023-10-23 NOTE — Telephone Encounter (Signed)
 Pharmacy Patient Advocate Encounter  Received notification from [place] MEDICA[employer] that Prior Authorization for Ozempic  (0.25 or 0.5 MG/DOSE) 2MG /3ML pen-injectors  has been APPROVED from 10/23/23 to 10/22/24. Ran test claim, Copay is $4. This test claim was processed through [facility]- copay amounts may vary at other pharmacies due to pharmacy/plan contracts, or as the patient moves through the different stages of their insurance plan.   PA #/Case ID/Reference #: [id number]

## 2023-10-23 NOTE — Telephone Encounter (Signed)
 Pharmacy Patient Advocate Encounter   Received notification from Pt Calls Messages that prior authorization for Ozempic  (0.25 or 0.5 MG/DOSE) 2MG /3ML pen-injectors  is required/requested.   Insurance verification completed.   The patient is insured through [facility] MEDICAID .   Per test claim: PA required; PA submitted to above mentioned insurance via Latent Key/confirmation #/EOC [id number] Status is pending

## 2023-10-23 NOTE — Telephone Encounter (Signed)
 Reviewed results with patient and patient voiced understanding.

## 2023-10-23 NOTE — Telephone Encounter (Signed)
 Patient says his Ozempic  Rx is requiring PA.

## 2023-10-24 NOTE — Telephone Encounter (Signed)
 Patient aware and verbalizes understanding.

## 2023-10-25 ENCOUNTER — Other Ambulatory Visit (HOSPITAL_COMMUNITY): Payer: Self-pay

## 2023-11-08 MED ORDER — ROSUVASTATIN CALCIUM 5 MG PO TABS: 5.0000 mg | ORAL_TABLET | Freq: Every day | ORAL | 3 refills | Status: AC

## 2023-11-23 DIAGNOSIS — G4733 Obstructive sleep apnea (adult) (pediatric): Secondary | ICD-10-CM | POA: Diagnosis not present

## 2023-12-02 ENCOUNTER — Other Ambulatory Visit: Payer: Self-pay | Admitting: Family

## 2023-12-02 DIAGNOSIS — K2100 Gastro-esophageal reflux disease with esophagitis, without bleeding: Secondary | ICD-10-CM

## 2023-12-02 DIAGNOSIS — M545 Low back pain, unspecified: Secondary | ICD-10-CM

## 2023-12-02 DIAGNOSIS — I10 Essential (primary) hypertension: Secondary | ICD-10-CM

## 2024-01-10 ENCOUNTER — Ambulatory Visit: Admitting: Family

## 2024-01-22 ENCOUNTER — Other Ambulatory Visit: Payer: Self-pay | Admitting: Family

## 2024-01-22 DIAGNOSIS — I10 Essential (primary) hypertension: Secondary | ICD-10-CM

## 2024-01-22 DIAGNOSIS — K2100 Gastro-esophageal reflux disease with esophagitis, without bleeding: Secondary | ICD-10-CM

## 2024-02-16 ENCOUNTER — Other Ambulatory Visit: Payer: Self-pay | Admitting: Family

## 2024-02-16 DIAGNOSIS — I10 Essential (primary) hypertension: Secondary | ICD-10-CM

## 2024-02-25 DIAGNOSIS — M545 Low back pain, unspecified: Secondary | ICD-10-CM

## 2024-02-25 DIAGNOSIS — K2100 Gastro-esophageal reflux disease with esophagitis, without bleeding: Secondary | ICD-10-CM

## 2024-03-25 ENCOUNTER — Ambulatory Visit: Admitting: Family

## 2024-03-25 ENCOUNTER — Encounter: Payer: Self-pay | Admitting: Family

## 2024-03-25 ENCOUNTER — Ambulatory Visit

## 2024-03-25 VITALS — BP 178/98 | HR 65 | Temp 97.6°F | Ht 70.5 in | Wt 332.4 lb

## 2024-03-25 DIAGNOSIS — G8929 Other chronic pain: Secondary | ICD-10-CM | POA: Diagnosis not present

## 2024-03-25 DIAGNOSIS — M542 Cervicalgia: Secondary | ICD-10-CM

## 2024-03-25 DIAGNOSIS — I10 Essential (primary) hypertension: Secondary | ICD-10-CM

## 2024-03-25 DIAGNOSIS — E66813 Obesity, class 3: Secondary | ICD-10-CM

## 2024-03-25 DIAGNOSIS — Z6841 Body Mass Index (BMI) 40.0 and over, adult: Secondary | ICD-10-CM | POA: Diagnosis not present

## 2024-03-25 DIAGNOSIS — M545 Low back pain, unspecified: Secondary | ICD-10-CM

## 2024-03-25 DIAGNOSIS — Z0001 Encounter for general adult medical examination with abnormal findings: Secondary | ICD-10-CM | POA: Diagnosis not present

## 2024-03-25 DIAGNOSIS — E1165 Type 2 diabetes mellitus with hyperglycemia: Secondary | ICD-10-CM | POA: Diagnosis not present

## 2024-03-25 DIAGNOSIS — F410 Panic disorder [episodic paroxysmal anxiety] without agoraphobia: Secondary | ICD-10-CM | POA: Diagnosis not present

## 2024-03-25 DIAGNOSIS — Z7985 Long-term (current) use of injectable non-insulin antidiabetic drugs: Secondary | ICD-10-CM

## 2024-03-25 DIAGNOSIS — Z0000 Encounter for general adult medical examination without abnormal findings: Secondary | ICD-10-CM

## 2024-03-25 DIAGNOSIS — F411 Generalized anxiety disorder: Secondary | ICD-10-CM

## 2024-03-25 DIAGNOSIS — K2100 Gastro-esophageal reflux disease with esophagitis, without bleeding: Secondary | ICD-10-CM

## 2024-03-25 DIAGNOSIS — G4733 Obstructive sleep apnea (adult) (pediatric): Secondary | ICD-10-CM

## 2024-03-25 LAB — LIPID PANEL

## 2024-03-25 MED ORDER — BUSPIRONE HCL 5 MG PO TABS: 5.0000 mg | ORAL_TABLET | Freq: Three times a day (TID) | ORAL | 2 refills | Status: AC | PRN

## 2024-03-25 MED ORDER — ESCITALOPRAM OXALATE 10 MG PO TABS: 10.0000 mg | ORAL_TABLET | Freq: Every day | ORAL | 2 refills | Status: AC

## 2024-03-25 MED ORDER — AMLODIPINE BESYLATE 5 MG PO TABS: 5.0000 mg | ORAL_TABLET | Freq: Every day | ORAL | 2 refills | Status: AC

## 2024-03-25 MED ORDER — FAMOTIDINE 20 MG PO TABS: ORAL_TABLET | ORAL | 2 refills | Status: AC

## 2024-03-25 MED ORDER — LOSARTAN POTASSIUM 100 MG PO TABS: 100.0000 mg | ORAL_TABLET | Freq: Every day | ORAL | 2 refills | Status: AC

## 2024-03-25 MED ORDER — METOPROLOL TARTRATE 25 MG PO TABS: 25.0000 mg | ORAL_TABLET | Freq: Two times a day (BID) | ORAL | 1 refills | Status: AC

## 2024-03-25 MED ORDER — TIRZEPATIDE 2.5 MG/0.5ML ~~LOC~~ SOAJ: 2.5000 mg | SUBCUTANEOUS | 2 refills | Status: AC

## 2024-03-25 MED ORDER — BACLOFEN 10 MG PO TABS: 10.0000 mg | ORAL_TABLET | Freq: Three times a day (TID) | ORAL | 2 refills | Status: AC

## 2024-03-25 MED ORDER — METFORMIN HCL 500 MG PO TABS: 500.0000 mg | ORAL_TABLET | Freq: Two times a day (BID) | ORAL | 2 refills | Status: AC

## 2024-03-25 NOTE — Patient Instructions (Signed)
 Stretched or Torn Ligament in the Neck (Cervical Sprain): What to Know A cervical sprain is a stretch or tear in one or more of the ligaments in the neck. Ligaments are the tissues that connect bones to each other. Cervical sprains can range from mild to severe. Severe cervical sprains can cause the spinal bones (vertebrae) in the neck to be unstable. This can result in spinal cord damage and serious nervous system problems. Healing time for a cervical sprain depends on the cause and extent of the injury. Most cervical sprains heal in 4-6 weeks. What are the causes? Cervical sprains may be caused by trauma, such as an injury from a motor vehicle accident, a fall, or a sudden forward and backward whipping movement of the head and neck (whiplash injury). Mild cervical sprains may be caused by wear and tear over time. What increases the risk? You are more likely to get a cervical sprain if: You take part in activities that have a high risk of trauma to the neck. These include contact sports, gymnastics, and diving. You have: Osteoarthritis of the spine. Poor strength and flexibility of the neck. Poor posture. You have had a neck injury in the past. You spend long periods in positions that put stress on the neck, such as sitting at a computer. What are the signs or symptoms? Symptoms of this condition include: Any of these problems in the neck, shoulders, or upper back: Pain or tenderness. Stiffness. Swelling. A burning feeling. Sudden tightening of neck muscles (spasms). Limited ability to move the neck. Headache. Dizziness. Nausea or vomiting. Weakness, numbness, or tingling in a hand or an arm. Symptoms may develop right away after injury or may develop over a few days. In some cases, symptoms may go away with treatment and return (recur) over time. How is this diagnosed? This condition may be diagnosed based on: Your symptoms, medical history, and a physical exam. Any recent injuries  or known neck problems that you have, such as arthritis in the neck. Imaging tests, such as X-rays, an MRI, or a CT scan. How is this treated? This condition is treated by resting and icing the injured area and doing physical therapy exercises to improve movement and strength. Heat therapy may be used 2-3 days after the injury if there is no swelling. Depending on the severity of your condition, treatment may also include: Keeping your neck in place (immobilized) for periods of time. This may be done using: A cervical collar. This supports your chin and the back of your head. A cervical traction device. This is a sling that holds up your head. It removes weight and pressure from your neck. Medicines for pain or other symptoms. Surgery. This is rare. Follow these instructions at home: Medicines Take over-the-counter and prescription medicines only as told by your health care provider. Ask your provider if the medicine prescribed to you: Requires you to avoid driving or using machinery. Can cause constipation. You may need to take these actions to prevent or treat constipation: Drink enough fluid to keep your pee pale yellow. Take over-the-counter or prescription medicines. Eat foods that are high in fiber, such as beans, whole grains, and fresh fruits and vegetables. Limit foods that are high in fat and processed sugars, such as fried or sweet foods. If you have a cervical collar: Wear the collar as told by your provider. Do not remove it unless told. Ask before making any adjustments to your collar. If you have long hair, keep it outside  of the collar. If you are allowed to remove the collar for cleaning and bathing: Follow instructions about how to remove it safely. Clean it by hand with mild soap and water and air-dry it completely. If your collar has removable pads, remove them every 1-2 days and wash them by hand with soap and water. Let them air-dry completely before putting them back  in the collar. Tell your provider if your skin under the collar has irritation or sores. Managing pain, stiffness, and swelling     Use a cervical traction device as told. If told, put ice on the affected area. Put ice in a plastic bag. Place a towel between your skin and the bag. Leave the ice on for 20 minutes, 2-3 times a day. If told, apply heat to the affected area before you exercise or as often as told by your provider. Use the heat source that your provider recommends, such as a moist heat pack or a heating pad. Place a towel between your skin and the heat source. Leave the heat on for 20-30 minutes. If your skin turns bright red, remove the ice or heat right away to prevent skin damage. The risk of damage is higher if you cannot feel pain, heat, or cold. Activity Do not drive while wearing a cervical collar. If you do not have a cervical collar, ask if it is safe to drive while your neck heals. Do not lift anything that is heavier than 10 lb (4.5 kg) until your provider says that it is safe. Rest as told by your provider. Avoid positions and activities that make your symptoms worse. Do physical therapy exercises as told by your provider or physical therapist. Return to your normal activities as told by your provider. Ask your provider what activities are safe for you. General instructions Do not use any products that contain nicotine or tobacco. These products include cigarettes, chewing tobacco, and vaping devices, such as e-cigarettes. These can delay healing. If you need help quitting, ask your provider. Keep all follow-up visits. Your provider will monitor your injury and activity level. How is this prevented? To prevent a cervical sprain from happening again: Use and maintain good posture. Make any needed adjustments to your workstation to help you do this. Exercise regularly as told by your provider or physical therapist. Avoid risky activities that may cause a cervical  sprain. Contact a health care provider if: You have symptoms that get worse or do not get better after 2 weeks of treatment. You have new symptoms. Your pain gets worse or does not get better with medicine. You have sores or irritated skin on your neck from wearing your cervical collar. Get help right away if: You have severe pain. You develop numbness, tingling, or weakness in any part of your body. You cannot move a part of your body (you have paralysis). You have neck pain along with severe dizziness or headache. This information is not intended to replace advice given to you by your health care provider. Make sure you discuss any questions you have with your health care provider. Document Revised: 12/14/2023 Document Reviewed: 09/09/2021 [employer] Patient Education  2025 [employer].

## 2024-03-26 LAB — CBC WITH DIFFERENTIAL/PLATELET
Basophils Absolute: 0 10*3/uL (ref 0.0–0.2)
Basos: 0 %
EOS (ABSOLUTE): 0.1 10*3/uL (ref 0.0–0.4)
Eos: 1 %
Hematocrit: 45.5 % (ref 37.5–51.0)
Hemoglobin: 14.6 g/dL (ref 13.0–17.7)
Immature Grans (Abs): 0 10*3/uL (ref 0.0–0.1)
Immature Granulocytes: 0 %
Lymphocytes Absolute: 4.3 10*3/uL — ABNORMAL HIGH (ref 0.7–3.1)
Lymphs: 37 %
MCH: 28.6 pg (ref 26.6–33.0)
MCHC: 32.1 g/dL (ref 31.5–35.7)
MCV: 89 fL (ref 79–97)
Monocytes Absolute: 0.8 10*3/uL (ref 0.1–0.9)
Monocytes: 7 %
Neutrophils Absolute: 6.3 10*3/uL (ref 1.4–7.0)
Neutrophils: 55 %
Platelets: 246 10*3/uL (ref 150–450)
RBC: 5.11 x10E6/uL (ref 4.14–5.80)
RDW: 14.3 % (ref 11.6–15.4)
WBC: 11.5 10*3/uL — ABNORMAL HIGH (ref 3.4–10.8)

## 2024-03-26 LAB — CMP14+EGFR
ALT: 24 [IU]/L (ref 0–44)
AST: 26 [IU]/L (ref 0–40)
Albumin: 4.7 g/dL (ref 4.1–5.1)
Alkaline Phosphatase: 106 [IU]/L (ref 47–123)
BUN/Creatinine Ratio: 9 (ref 9–20)
BUN: 13 mg/dL (ref 6–24)
Bilirubin Total: 0.3 mg/dL (ref 0.0–1.2)
CO2: 28 mmol/L (ref 20–29)
Calcium: 9.4 mg/dL (ref 8.7–10.2)
Chloride: 103 mmol/L (ref 96–106)
Creatinine, Ser: 1.4 mg/dL — AB (ref 0.76–1.27)
Globulin, Total: 2.6 g/dL (ref 1.5–4.5)
Glucose: 93 mg/dL (ref 70–99)
Potassium: 4.5 mmol/L (ref 3.5–5.2)
Sodium: 144 mmol/L (ref 134–144)
Total Protein: 7.3 g/dL (ref 6.0–8.5)
eGFR: 63 mL/min/{1.73_m2}

## 2024-03-26 LAB — MICROALBUMIN / CREATININE URINE RATIO
Creatinine, Urine: 263.7 mg/dL
Microalb/Creat Ratio: 5 mg/g{creat} (ref 0–29)
Microalbumin, Urine: 12.8 ug/mL

## 2024-03-26 LAB — BAYER DCA HB A1C WAIVED: HB A1C (BAYER DCA - WAIVED): 6.9 % — ABNORMAL HIGH (ref 4.8–5.6)

## 2024-03-26 LAB — LIPID PANEL
Cholesterol, Total: 155 mg/dL (ref 100–199)
HDL: 40 mg/dL
LDL CALC COMMENT:: 3.9 ratio (ref 0.0–5.0)
LDL Chol Calc (NIH): 82 mg/dL (ref 0–99)
Triglycerides: 194 mg/dL — AB (ref 0–149)
VLDL Cholesterol Cal: 33 mg/dL (ref 5–40)

## 2024-03-26 LAB — VITAMIN B12: Vitamin B-12: 636 pg/mL (ref 232–1245)

## 2024-03-28 ENCOUNTER — Telehealth: Payer: Self-pay | Admitting: Family Medicine

## 2024-03-28 NOTE — Telephone Encounter (Unsigned)
 Copied from CRM [id number]. Topic: Clinical - Medication Prior Auth >> Mar 28, 2024 12:32 PM Everette C wrote: Reason for CRM: The patient has called to request completion of a piror authorization for their recent prescription of tirzepatide  (MOUNJARO ) 2.5 MG/0.5ML Pen [[id number]]   Please contact the patient further if needed

## 2024-04-08 ENCOUNTER — Ambulatory Visit: Admitting: Family
